# Patient Record
Sex: Male | Born: 1935 | Race: White | Hispanic: No | Marital: Married | Smoking: Never smoker
Health system: Southern US, Community
[De-identification: ages and names within clinical notes are randomized; demographics above are authoritative.]

## PROBLEM LIST (undated history)

## (undated) DIAGNOSIS — H9190 Unspecified hearing loss, unspecified ear: Secondary | ICD-10-CM

## (undated) DIAGNOSIS — M459 Ankylosing spondylitis of unspecified sites in spine: Secondary | ICD-10-CM

## (undated) DIAGNOSIS — M069 Rheumatoid arthritis, unspecified: Secondary | ICD-10-CM

## (undated) DIAGNOSIS — K5730 Diverticulosis of large intestine without perforation or abscess without bleeding: Secondary | ICD-10-CM

## (undated) DIAGNOSIS — I10 Essential (primary) hypertension: Secondary | ICD-10-CM

## (undated) HISTORY — PX: INGUINAL HERNIA REPAIR: SUR1180

## (undated) HISTORY — DX: Unspecified hearing loss, unspecified ear: H91.90

## (undated) HISTORY — DX: Diverticulosis of large intestine without perforation or abscess without bleeding: K57.30

## (undated) HISTORY — DX: Ankylosing spondylitis of unspecified sites in spine: M45.9

## (undated) HISTORY — PX: JOINT REPLACEMENT: SHX530

## (undated) HISTORY — DX: Rheumatoid arthritis, unspecified: M06.9

## (undated) HISTORY — PX: REVISION TOTAL HIP ARTHROPLASTY: SHX766

## (undated) HISTORY — PX: ADENOIDECTOMY: SUR15

## (undated) HISTORY — PX: EYE SURGERY: SHX253

## (undated) HISTORY — PX: TONSILLECTOMY: SUR1361

## (undated) HISTORY — DX: Essential (primary) hypertension: I10

---

## 1998-07-11 ENCOUNTER — Ambulatory Visit (HOSPITAL_BASED_OUTPATIENT_CLINIC_OR_DEPARTMENT_OTHER): Admission: RE | Admit: 1998-07-11 | Discharge: 1998-07-11 | Payer: Self-pay | Admitting: General Surgery

## 2003-03-03 ENCOUNTER — Encounter: Payer: Self-pay | Admitting: Internal Medicine

## 2004-06-26 ENCOUNTER — Encounter: Payer: Self-pay | Admitting: Internal Medicine

## 2004-06-26 LAB — CONVERTED CEMR LAB: PSA: 2.2 ng/mL

## 2004-07-10 ENCOUNTER — Encounter: Payer: Self-pay | Admitting: Internal Medicine

## 2004-08-05 ENCOUNTER — Ambulatory Visit: Payer: Self-pay | Admitting: Internal Medicine

## 2004-08-05 ENCOUNTER — Encounter: Payer: Self-pay | Admitting: Internal Medicine

## 2004-08-05 LAB — HM COLONOSCOPY

## 2004-08-28 ENCOUNTER — Ambulatory Visit: Payer: Self-pay | Admitting: Internal Medicine

## 2005-05-06 ENCOUNTER — Ambulatory Visit: Payer: Self-pay | Admitting: Internal Medicine

## 2005-11-03 ENCOUNTER — Ambulatory Visit: Payer: Self-pay | Admitting: Internal Medicine

## 2006-03-22 ENCOUNTER — Encounter: Admission: RE | Admit: 2006-03-22 | Discharge: 2006-03-22 | Payer: Self-pay | Admitting: Orthopedic Surgery

## 2006-06-22 ENCOUNTER — Ambulatory Visit: Payer: Self-pay | Admitting: Internal Medicine

## 2006-07-27 ENCOUNTER — Inpatient Hospital Stay (HOSPITAL_COMMUNITY): Admission: RE | Admit: 2006-07-27 | Discharge: 2006-07-30 | Payer: Self-pay | Admitting: Orthopedic Surgery

## 2007-10-24 ENCOUNTER — Ambulatory Visit: Payer: Self-pay | Admitting: Internal Medicine

## 2007-10-25 ENCOUNTER — Encounter: Payer: Self-pay | Admitting: Internal Medicine

## 2007-10-25 DIAGNOSIS — K5730 Diverticulosis of large intestine without perforation or abscess without bleeding: Secondary | ICD-10-CM | POA: Insufficient documentation

## 2007-10-25 DIAGNOSIS — M069 Rheumatoid arthritis, unspecified: Secondary | ICD-10-CM | POA: Insufficient documentation

## 2007-10-25 DIAGNOSIS — M459 Ankylosing spondylitis of unspecified sites in spine: Secondary | ICD-10-CM | POA: Insufficient documentation

## 2008-06-06 ENCOUNTER — Ambulatory Visit: Payer: Self-pay | Admitting: Internal Medicine

## 2008-06-06 DIAGNOSIS — H9190 Unspecified hearing loss, unspecified ear: Secondary | ICD-10-CM | POA: Insufficient documentation

## 2008-06-06 LAB — CONVERTED CEMR LAB
Blood in Urine, dipstick: NEGATIVE
Nitrite: NEGATIVE
Protein, U semiquant: NEGATIVE
WBC Urine, dipstick: NEGATIVE

## 2008-06-07 LAB — CONVERTED CEMR LAB
AST: 19 units/L (ref 0–37)
Albumin: 4.2 g/dL (ref 3.5–5.2)
Alkaline Phosphatase: 66 units/L (ref 39–117)
BUN: 15 mg/dL (ref 6–23)
Bilirubin, Direct: 0.1 mg/dL (ref 0.0–0.3)
Chloride: 110 meq/L (ref 96–112)
Eosinophils Absolute: 0.2 10*3/uL (ref 0.0–0.7)
Eosinophils Relative: 3.4 % (ref 0.0–5.0)
GFR calc non Af Amer: 79 mL/min
HDL: 37.2 mg/dL — ABNORMAL LOW (ref >39.0)
MCV: 94.2 fL (ref 78.0–100.0)
Monocytes Relative: 8.1 % (ref 3.0–12.0)
Neutrophils Relative %: 59.8 % (ref 43.0–77.0)
Platelets: 235 10*3/uL (ref 150–400)
Potassium: 4.4 meq/L (ref 3.5–5.1)
Sodium: 143 meq/L (ref 135–145)
Total CHOL/HDL Ratio: 5.2
VLDL: 19 mg/dL (ref 0–40)
WBC: 4.8 10*3/uL (ref 4.5–10.5)

## 2008-06-26 ENCOUNTER — Telehealth: Payer: Self-pay | Admitting: Internal Medicine

## 2008-07-03 ENCOUNTER — Encounter: Payer: Self-pay | Admitting: Internal Medicine

## 2008-11-29 ENCOUNTER — Ambulatory Visit: Payer: Self-pay | Admitting: Internal Medicine

## 2008-12-06 ENCOUNTER — Ambulatory Visit: Payer: Self-pay

## 2008-12-06 ENCOUNTER — Encounter: Payer: Self-pay | Admitting: Internal Medicine

## 2009-09-18 ENCOUNTER — Ambulatory Visit: Payer: Self-pay | Admitting: Family Medicine

## 2009-12-23 ENCOUNTER — Telehealth: Payer: Self-pay | Admitting: Internal Medicine

## 2009-12-30 ENCOUNTER — Ambulatory Visit: Payer: Self-pay | Admitting: Internal Medicine

## 2009-12-30 DIAGNOSIS — I10 Essential (primary) hypertension: Secondary | ICD-10-CM | POA: Insufficient documentation

## 2010-07-03 ENCOUNTER — Ambulatory Visit: Payer: Self-pay | Admitting: Internal Medicine

## 2010-07-03 LAB — CONVERTED CEMR LAB
Bilirubin Urine: NEGATIVE
Blood in Urine, dipstick: NEGATIVE
Ketones, urine, test strip: NEGATIVE
Protein, U semiquant: NEGATIVE
Urobilinogen, UA: 0.2

## 2010-07-04 LAB — CONVERTED CEMR LAB
ALT: 21 units/L (ref 0–53)
AST: 21 units/L (ref 0–37)
BUN: 18 mg/dL (ref 6–23)
Basophils Absolute: 0 10*3/uL (ref 0.0–0.1)
Bilirubin, Direct: 0.1 mg/dL (ref 0.0–0.3)
Calcium: 9.2 mg/dL (ref 8.4–10.5)
Cholesterol: 199 mg/dL (ref 0–200)
Creatinine, Ser: 1 mg/dL (ref 0.4–1.5)
Eosinophils Relative: 3.3 % (ref 0.0–5.0)
GFR calc non Af Amer: 74.54 mL/min (ref >60)
Glucose, Bld: 117 mg/dL — ABNORMAL HIGH (ref 70–99)
HDL: 36.9 mg/dL — ABNORMAL LOW (ref >39.00)
LDL Cholesterol: 134 mg/dL — ABNORMAL HIGH (ref 0–99)
Monocytes Absolute: 0.6 10*3/uL (ref 0.1–1.0)
Monocytes Relative: 10.3 % (ref 3.0–12.0)
Neutrophils Relative %: 57.9 % (ref 43.0–77.0)
Platelets: 237 10*3/uL (ref 150.0–400.0)
RDW: 13.4 % (ref 11.5–14.6)
Total Bilirubin: 0.8 mg/dL (ref 0.3–1.2)
Triglycerides: 143 mg/dL (ref 0.0–149.0)
VLDL: 28.6 mg/dL (ref 0.0–40.0)
WBC: 5.7 10*3/uL (ref 4.5–10.5)

## 2010-07-10 LAB — CONVERTED CEMR LAB: Hgb A1c MFr Bld: 6.3 % (ref 4.6–6.5)

## 2010-10-21 NOTE — Assessment & Plan Note (Signed)
Summary: fu on bp/njr   Vital Signs:  Patient profile:   75 year old male Weight:      204 pounds BMI:     28.96 Temp:     97.6 degrees F oral Pulse rate:   64 / minute Pulse rhythm:   regular Resp:     12 per minute BP sitting:   128 / 78  (left arm) Cuff size:   regular  Vitals Entered By: Gladis Riffle, RN (December 30, 2009 11:17 AM) CC: FU BP, does not check BP at home--med review and refill Is Patient Diabetic? No   CC:  FU BP and does not check BP at home--med review and refill.  History of Present Illness:  Follow-Up Visit      This is a 75 year old man who presents for Follow-up visit.  The patient denies chest pain and palpitations.  Since the last visit the patient notes no new problems or concerns.  The patient reports taking meds as prescribed.  When questioned about possible medication side effects, the patient notes none.    All other systems reviewed and were negative   Preventive Screening-Counseling & Management  Alcohol-Tobacco     Smoking Status: never  Current Medications (verified): 1)  Hydrochlorothiazide 25 Mg  Tabs (Hydrochlorothiazide) .... Take 1 Tablet By Mouth Once A Day--Needs Office Visit in March 2)  Celebrex 200 Mg Caps (Celecoxib) .... As Needed  Allergies (verified): No Known Drug Allergies  Past History:  Past Surgical History: Last updated: 10/25/2007 THA x 2- 1978 & 1992 Inguinal herniorrhaphy repair X3 T&A  Family History: Last updated: 10/25/2007 Family History Diabetes 1st degree relative Family History of Cardiovascular disorder mother deceased age 82 (heart problems, DM) father deceased age 41 (DM) 3 brothers healthy 1 sister healthy  Social History: Last updated: 10/25/2007 Never Smoked Retired Drug use-no Alcohol use-no Married 1 son healthy  Risk Factors: Smoking Status: never (12/30/2009)  Past Medical History: THA x 2- 1978 & 1992 Colonoscopy1115/1996 Rheumatoid arthritis ankylosing  spondylitis Diverticulosis, colon hemorrhoids Hypertension  Physical Exam  General:  Well-developed,well-nourished,in no acute distress; alert,appropriate and cooperative throughout examination Neck:  No deformities, masses, or tenderness noted. Lungs:  normal respiratory effort and no intercostal retractions.   Heart:  normal rate and regular rhythm.     Impression & Recommendations:  Problem # 1:  HYPERTENSION (ICD-401.9) tolerating meds continue current medications  His updated medication list for this problem includes:    Hydrochlorothiazide 25 Mg Tabs (Hydrochlorothiazide) .Marland Kitchen... Take 1 tablet by mouth once a day  BP today: 128/78 Prior BP: 120/68 (09/18/2009)  Labs Reviewed: K+: 4.4 (06/06/2008) Creat: : 1.0 (06/06/2008)   Chol: 192 (06/06/2008)   HDL: 37.2 (06/06/2008)   LDL: 136 (06/06/2008)   TG: 95 (06/06/2008)  Complete Medication List: 1)  Hydrochlorothiazide 25 Mg Tabs (Hydrochlorothiazide) .... Take 1 tablet by mouth once a day 2)  Celebrex 200 Mg Caps (Celecoxib) .... As needed Prescriptions: HYDROCHLOROTHIAZIDE 25 MG  TABS (HYDROCHLOROTHIAZIDE) Take 1 tablet by mouth once a day  #90 x 3   Entered and Authorized by:   Birdie Sons MD   Signed by:   Birdie Sons MD on 12/30/2009   Method used:   [facility]  [address] [id number]       [address]       [place], [place]  [address]       Ph: [phone] or [phone]  Fax: [phone]   RxID:   [id number]

## 2010-10-21 NOTE — Progress Notes (Signed)
Summary: req call  Phone Note Call from Patient Call back at [phone]   Reason for Call: Talk to Nurse Summary of Call: Says his prescriptions for hydrochlorothiazide are being marked ov necessary.  [place] the beach [place] and is scheduled up through May, could schedule appointment then if necessary.  He prefers to delay his ov/physical until fall, but he needs to get his pills until then also.  Please let him know what he has to do to get his Rx continuously and that this is the only med he takes & needs.   Initial call taken by: Rudy Jew, RN,  December 23, 2009 12:45 PM  Follow-up for Phone Call        Left message on cell that pt needs ov when is back in town to evaluate how med is working.  He can wait until fall for cpx.  Is to call back for ov and call pharmacy for refill request. Follow-up by: Gladis Riffle, RN,  December 23, 2009 3:48 PM

## 2010-10-21 NOTE — Assessment & Plan Note (Signed)
Summary: CPX (PT WILL COME IN FASTING) - FLU SHOT // RS--Rm 13   Vital Signs:  Patient profile:   75 year old male Height:      70.5 inches Weight:      211 pounds BMI:     29.96 Temp:     97.9 degrees F oral Pulse rate:   66 / minute Pulse rhythm:   regular Resp:     16 per minute BP sitting:   120 / 70  (left arm) Cuff size:   large  Vitals Entered By: Mervin Kung CMA Duncan Dull) (July 03, 2010 7:58 AM) CC: Rm 13  Pt here for fasting physical. Is Patient Diabetic? No Pain Assessment Patient in pain? no      Comments Pt states hs has not used Celebrex in years. Other med dose and directions are correct. Nicki Guadalajara Fergerson CMA (AAMA)  July 03, 2010 8:03 AM    CC:  Rm 13  Pt here for fasting physical..  History of Present Illness: CPX  Preventive Screening-Counseling & Management  Alcohol-Tobacco     Smoking Status: never  Caffeine-Diet-Exercise     Caffeine use/day: 3 drinks daily  Current Problems (verified): 1)  Hypertension  (ICD-401.9) 2)  Hearing Loss  (ICD-389.9) 3)  Preventive Health Care  (ICD-V70.0) 4)  Diverticulosis, Colon  (ICD-562.10) 5)  Ankylosing Spondylitis  (ICD-720.0) 6)  Rheumatoid Arthritis  (ICD-714.0)  Current Medications (verified): 1)  Hydrochlorothiazide 25 Mg  Tabs (Hydrochlorothiazide) .... Take 1 Tablet By Mouth Once A Day  Allergies (verified): No Known Drug Allergies  Past History:  Family History: Last updated: 10/25/2007 Family History Diabetes 1st degree relative Family History of Cardiovascular disorder mother deceased age 100 (heart problems, DM) father deceased age 40 (DM) 3 brothers healthy 1 sister healthy  Social History: Last updated: 10/25/2007 Never Smoked Retired Drug use-no Alcohol use-no Married 1 son healthy  Past Medical History: THA x 2- 1978 & 1992 Colonoscopy1115/1996 Rheumatoid arthritis--as teenager. ? JRA ankylosing spondylitis---no sxs since age 40s Diverticulosis,  colon hemorrhoids Hypertension  Past Surgical History: Reviewed history from 10/25/2007 and no changes required. THA x 2- 1978 & 1992 Inguinal herniorrhaphy repair X3 T&A  Social History: Caffeine use/day:  3 drinks daily   Impression & Recommendations:  Problem # 1:  PREVENTIVE HEALTH CARE (ICD-V70.0) health maintenance is up to date. I've advised him to exercise regularly. Follow low fat, low calorie diet. Orders: Venipuncture ([address]109) TLB-Lipid Panel (80061-LIPID) TLB-BMP (Basic Metabolic Panel-BMET) (80048-METABOL) UA Dipstick w/o Micro (automated)  (81003) Specimen Handling (60454) TLB-CBC Platelet - w/Differential (85025-CBCD) TLB-Hepatic/Liver Function Pnl (80076-HEPATIC) TLB-TSH (Thyroid Stimulating Hormone) (84443-TSH) TLB-PSA (Prostate Specific Antigen) (84153-PSA)  Complete Medication List: 1)  Hydrochlorothiazide 25 Mg Tabs (Hydrochlorothiazide) .... Take 1 tablet by mouth once a day  Other Orders: Flu Vaccine 57yrs + MEDICARE PATIENTS (U9811) Administration Flu vaccine - MCR (G0008) Tdap => 69yrs IM (91478) Admin 1st Vaccine (29562)     Current Allergies (reviewed today): No known allergies   Flu Vaccine Consent Questions     Do you have a history of severe allergic reactions to this vaccine? no    Any prior history of allergic reactions to egg and/or gelatin? no    Do you have a sensitivity to the preservative Thimersol? no    Do you have a past history of Guillan-Barre Syndrome? no    Do you currently have an acute febrile illness? no    Have you ever had a severe reaction to latex? no  Vaccine information given and explained to patient? yes    Are you currently pregnant? no    Lot Number:[id number]   Exp Date:03/21/2011   Site Given  Right Deltoid IM. Nicki Guadalajara Fergerson CMA (AAMA)  July 03, 2010 8:07 AM Physical Exam General Appearance: well developed, well nourished, no acute distress Eyes: conjunctiva and lids normal, PERRL, EOMI, Ears,  Nose, Mouth, Throat: TM clear, nares clear, oral exam WNL Neck: supple, no lymphadenopathy, no thyromegaly, no JVD Respiratory: clear to auscultation and percussion, respiratory effort normal Cardiovascular: regular rate and rhythm, S1-S2, no murmur, rub or gallop, no bruits, peripheral pulses normal and symmetric, no cyanosis, clubbing, edema or varicosities Chest: no scars, masses, tenderness; no asymmetry, skin changes,   Gastrointestinal: soft, non-tender; no hepatosplenomegaly, masses; active bowel sounds all quadrants,  no masses, tenderness, hemorrhoids  Genitourinary: no hernia,  or prostate enlargement Lymphatic: no cervical, axillary or inguinal adenopathy Musculoskeletal: gait normal, muscle tone and strength WNL, no joint swelling, effusions, discoloration, crepitus  Skin: clear, good turgor, color WNL, no rashes, lesions, or ulcerations Neurologic: normal mental status, normal reflexes, normal strength, sensation, and motion Psychiatric: alert; oriented to person, place and time Other Exam:       Immunizations Administered:  Tetanus Vaccine:    Vaccine Type: Tdap    Site: left deltoid    Mfr: GlaxoSmithKline    Dose: 0.5 ml    Route: IM    Given by: Mervin Kung CMA (AAMA)    Exp. Date: 07/10/2012    Lot #: [id number]    VIS given: 08/08/08 version given July 03, 2010.   Laboratory Results   Urine Tests    Routine Urinalysis   Color: yellow Appearance: Clear Glucose: negative   (Normal Range: Negative) Bilirubin: negative   (Normal Range: Negative) Ketone: negative   (Normal Range: Negative) Spec. Gravity: 1.025   (Normal Range: 1.003-1.035) Blood: negative   (Normal Range: Negative) pH: 5.0   (Normal Range: 5.0-8.0) Protein: negative   (Normal Range: Negative) Urobilinogen: 0.2   (Normal Range: 0-1) Nitrite: negative   (Normal Range: Negative) Leukocyte Esterace: negative   (Normal Range: Negative)    Comments: Rita Ohara  July 03, 2010  10:13 AM

## 2010-12-25 ENCOUNTER — Other Ambulatory Visit: Payer: Self-pay | Admitting: Internal Medicine

## 2010-12-29 ENCOUNTER — Other Ambulatory Visit: Payer: Self-pay | Admitting: *Deleted

## 2010-12-29 NOTE — Telephone Encounter (Signed)
Opened in error

## 2011-01-16 ENCOUNTER — Other Ambulatory Visit: Payer: Self-pay | Admitting: Internal Medicine

## 2011-01-16 MED ORDER — HYDROCHLOROTHIAZIDE 25 MG PO TABS: 25.0000 mg | ORAL_TABLET | Freq: Every day | ORAL | Status: DC

## 2011-01-16 NOTE — Telephone Encounter (Signed)
rx sent in electronically 

## 2011-01-16 NOTE — Telephone Encounter (Signed)
Pt called and tried to get [facility] [address] [phone] and was told that pt would need to sch ov, before more refills could be given. Pt said that Dr Cato Mulligan told pt, the next this happens, to call office and pt would not need to sch ov. Pls call in asap today.

## 2011-02-06 NOTE — Letter (Signed)
June 22, 2006     Madlyn Frankel. Charlann Boxer, M.D.  [facility]  [address]  [address]  [place], [place] [address]   RE:  Jason, [name]  MRN:  [id number]  /  DOB:  1936-04-28   Dear Susy Frizzle:   Thank you for referring Mr. Custis for preop medical clearance.  I  understand that he needs a revision of his right hip replacement.  A full  evaluation is in the medical records at my office.  I did perform a complete  evaluation and EKG in the office, both of which were normal.  Also, his  history is not concerning for any coronary artery disease or respiratory  problems.  It is worth noting he had a nuclear stress test in October 2005  that was normal.  I do not think any further evaluation is necessary prior  to surgery.  He would be at normal risk for his age.  If you need any  perioperative management help for medical problems, please contact me.    Sincerely,      Bruce H. Swords, MD    BHS/MedQ  DD:  06/22/2006  DT:  06/22/2006  Job #:  [id number]
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
## 2011-02-06 NOTE — Discharge Summary (Signed)
Jason [name], Jason [name]            ACCOUNT NO.:  [id number]   MEDICAL RECORD NO.:  [id number]          PATIENT TYPE:  INP   LOCATION:  1507                         FACILITY:  [facility]   PHYSICIAN:  Madlyn Frankel. Charlann Boxer, M.D.  DATE OF BIRTH:  28-Oct-1935   DATE OF ADMISSION:  07/27/2006  DATE OF DISCHARGE:  07/30/2006                               DISCHARGE SUMMARY   ADMITTING DIAGNOSES:  1. Osteoarthritis.  2. Status post total  hip replacement.  3. Hypopotassemia.  4. Hypertension.   DISCHARGE DIAGNOSES:  1. Osteoporosis.  2. Status post total  hip replacement.  3. Hypopotassemia.  4. Hypertension.  5. Postoperative hypokalemia.   PROCEDURE:  Revision of right total hip arthroplasty.  Surgeon - Madlyn Frankel. Charlann Boxer, M.D.  Assistant - Sharlet Salina L. [address], PA.   CONSULTS:  None.   HISTORY OF PRESENT ILLNESS:  Jason [name] is a very pleasant 75 year old  male who has had bilateral hip replacements.  The right was done in  1991.  He has had persistent anterior thigh pain since 2000.  Significant pain with activity and hip flexion.  He had bone scans, as  well as labs to rule out infection.  The bone scans showed increased  activity in the femoral stem, indicating loosening of the stem.  He has  significant discomfort with any kind of activity, and, due to the  loosening, it called for a revision total hip replacement.   PREADMISSION LABORATORIES:  CBC which showed his preadmission hemoglobin  of 14.5, hematocrit 42.2, tracked throughout his course of stay.  Upon  discharge, hemoglobin 10.4, hematocrit 29.8.  Preadmission white cell  differential within normal limits.  Preadmission coagulation within  normal limits.  Preadmission routine chemistries showed a sodium of 140,  potassium 4.8, glucose 111.  Upon discharge, his glucose was 136,  potassium 3.5, glucose 161.  Preadmission urinalysis was negative.  Preadmission blood was A positive.  Preadmission chest view shows no  active  cardiopulmonary disease.  Postoperative pelvis showed new right  hip replacement without complicating features.  Preadmission EKG showed  normal EKG.   HOSPITAL COURSE:  The patient tolerated the procedure well and was  admitted from the PACU to the orthopedic floor.  On postoperative day  #1, he was doing well.  Neuromuscularly and vascularly intact, which he  remained so throughout his course of stay.  The dressing was clean, dry  and intact.  Hemovac was pulled intact.  Discharge was planned for the  following Friday.  On postoperative day #2, he had some postoperative  hypokalemia and was replaced with K-Dur 40 mEq once.  He has not had a  bowel movement since his surgery.  Physical therapy was begun, and he  used a rolling walker.  By postoperative day #3, he was doing fine and  was having some problem with sleep.  He did not yet have a bowel  movement, so he was given an enema or choice and laxative of choice.  Physical therapy progressed well, and by the time of discharge, he was  using the rolling walker, walking up to 250 feet, and was  recommended 7  times per week physical therapy.  Occupation therapy - he was able to do  transfers from bed to chair to bathroom.   DISCHARGE DISPOSITION:  Stable, improved.  Discharged home with home  health care physical therapy in [facility].   DISCHARGE MEDICATIONS:  1. HCTZ 25 mg one p.o. q.a.m.  2. Calcium one p.o. two daily.  3. Lovenox 40 mg one subcutaneous q.24h. x11 days.  4. Robaxin 500 mg 1-2 p.o. q.4-6h. p.r.n. muscle spasm.  5. Norco 5/325, 1-2 p.o. q.4-6h. p.r.n. pain.  6. Ambien 5 mg one p.o. q.h.s. p.r.n. sleeplessness.  7. K-Dur 40 mEq one p.o. daily x7 days.  8. Iron 325 x3 daily x3 weeks.   DISCHARGE PHYSICAL THERAPY:  Continue physical therapy, partial  weightbearing, 50%, with the use of a rolling walker.  Once he returns  to our clinic, will reassess his progress, and eventually will progress  to weightbearing  as tolerated with the use of a  cane.  He wanted to  work on his upper body strength and his balance.  Want to increase range  of motion, minimize pain, increase strength, and encourage activities of  daily living.   WOUND CARE:  Keep the wound dry.  Change the dressings daily.  Will see  him back in the office for followup on the incisions and recheck the  wound.   DISCHARGE FOLLOW UP:  Follow up with Dr. Charlann Boxer at [phone] in  approximately 7-10 days for a wound check.  We look forward to treating  this very pleasant 75 year old gentleman.     ______________________________  Yetta Glassman. [address], [place]      Madlyn Frankel. Charlann Boxer, M.D.  Electronically Signed    BLM/MEDQ  D:  09/16/2006  T:  09/16/2006  Job:  [id number]

## 2011-02-06 NOTE — Op Note (Signed)
Jason [name], [name]            ACCOUNT NO.:  [id number]   MEDICAL RECORD NO.:  [id number]          PATIENT TYPE:  INP   LOCATION:  NA                           FACILITY:  [facility]   PHYSICIAN:  Madlyn Frankel. Charlann Boxer, M.D.  DATE OF BIRTH:  [birth year]   DATE OF PROCEDURE:  07/27/2006  DATE OF DISCHARGE:                                 OPERATIVE REPORT   PREOPERATIVE DIAGNOSES:  1. Failed right total hip replacement; and,  2. Loose femoral acetabular component.   POSTOPERATIVE DIAGNOSES:  1. Failed right total hip replacement; and,  2. Loose femoral acetabular component.   OPERATION PERFORMED:  Revision of right total hip replacement specifically  utilizing a Series II acetabular liner, 10-degree, that matched the 58 cup  that was present with a 10-degree loop.  The femoral stem was size 15, 10-  inch bowed femur or Solution stem with a 32 plus 5 ceramic ball.   SURGEON:  Madlyn Frankel. Charlann Boxer, M.D.   ASSISTANT:  Yetta Glassman. Mann, P. A.   ANESTHESIA:  General.   ESTIMATED BLOOD LOSS:  The blood loss was 300 mL.   DRAINS:  The drains were times one.   COMPLICATIONS:  None.   INDICATIONS FOR THE SURGERY:  [name] is a 75 year old gentleman who  presented to the office for Dr. Darrelyn Hillock.  He had right total hip replacement  performed in 1991 by Dr. Trellis Paganini.  The patient had a history of osteolysis  that was treated with some Fosamax and did not require any revision surgery.  Nonetheless, he presented to the office with thigh pain with activity.  Radiographs were concerning for a loose femoral stem.   Upon review of him the surgical options consent was obtained for revision  hip surgery.  We reviewed the risks of infection, DVT, need for revision  surgery, and perioperative complications associated with revision surgery.  Consent was obtained.   DESCRIPTION OF THE OPERATION:  The patient was brought to the operative  theater.  Once adequate anesthesia and preoperative antibiotics  were  administered the patient was positioned in the left lateral decubitus  position with the right side up.  The right lower extremity was then prepped  and draped in a sterile fashion.  Please note that preoperatively the  patient has been worked up for infection  and this was negative.  There was  no concern for infection.  The intraoperative findings revealed no findings  for infection.   The patient's previous incision was identified, marked out and prepared for  a distal incision for any femoral fracture, window or osteotomy necessary to  remove the component.  Posterior incision was used and with sharp dissection  it was carried down to the iliotibial band and gluteal fascia.  This was  incised along the incision.  Sharp dissection was carried done through the  scar tissue present posteriorly.  The hip joint was identified.   After debriding some of the scar tissue and pseudocapsule tissue off the  proximal femur the hip was dislocated.  It was at this time that we noted  that the femoral stem  was grossly loose.  This was able be removed much to  my pleasure with the use of a bone tamp and my hands.  I did not leave a  bullet on the tip of this Osteonics and Tru-Fit stem it remained in  continuity with the stem.  It did not require any further  advanced surgery  i.e. osteotomy.   With the femoral stem out attention was first directed to the acetabulum.  Following further debridement, exposing the rim of the cup we identified the  cup was securely fit into the acetabulum.  I then used a 4.5 drill bit and a  6.5 cancellous screw to remove the cup.  The cup position was in  approximately 40-45 degrees of adduction and only about 10 degrees of  forward flexion.  For this reason I went ahead and placed a 10-degree lip  liner, which was what I removed with it posteriorly at about the nine to 10  o'clock position.   Following placement of this attention was focused on the femur for  the vast  majority of the case.  Proximal femoral closure was obtained including  debridement.  It was noted that following the debridement the proximal femur  was very osteolytic and very whimsical with regards to bone. There was some  fracture.  The calcar region of bone was not very good at all.  Based on  this, although I had preoperatively  planned for the primary option of an S-  ROM approximately loading pin I had to convert to a solution of fluid porous  option based on the proximal bone quality.   Preparation of the proximal bone was carried out until after placement of  the stem in this nature without placing it into varus.  We also prepped out  the fluoroscopy and fully prepped it out so it could be flipped over in a U  fashion over the patient's leg to allow for imaging.  Based on the fact that  the stem had tucked into varus, and it was touching and abutting on the  lateral and anterolateral cortex  I wanted to make sure that the guidewire  was passed through it, and there was also noted to be a bony pedestal.  With  fluoroscopy ready I then passed a 4.5 drill bit through the bony pedestal  under radiographic imaging, AP and lateral planes.  I then passed a  guidewire to the knee.  I then reamed with flexible reamers beginning with a  size eight and increasing by half millimeter increments all the way up to a  14 mm increment.   At this point I straight reamed beginning with a 12.5 and 13, and 13.5 and  14.  At this point I evaluated  the distance that I would need for the stem.  It was at this point with fluoroscopy and trials that I determined that a 10  inch stem would give me the best bony contact distally, and distal to where  the old stem was.  Based on the bone quality and the bone that was present I  chose to go up to a size 15 mm stem.  I went ahead and reamed two 15 at both of the flexible reamers and also the straight reamers to help pass this area  where the old  stem had been.  Broaching was then carried out starting with a  12, then 13.5 and 15 first with a small body and then the large body.  Given  the fact that we did a 10-inch bowed solutio stem it only had a large body.  There was very little purchase in this proximal bone given the ectatic and  lytic nature of it.   Trial reduction was carried out with this 10-inch bowed stem.  I was happy  with the position of the stem in relationship to the tip of the trochanter.  Given all these parameters the final 10-inch bowed stem was opened.  The  final polyethylene insert was opened and held on the ceramic ball.  The  trial components were removed and the final acetabular liner was positioned.  This was positioned in the same orientation as the trial at the slot between  the nine and 10 o'clock position.  This was impacted without difficulty and  without any interference around the rim.   Following this attention was directed to the femur.  With the flipped  fluoroscopy still available I passed the stem by hand and initial soft taps  passed  where the old stem was.  At this point holding the stem at  approximately 25-30 degrees of anteversion I impacted the stem down with  multiple taps.  I had gotten it down to the tip of the trunion was at the  level of the trochanter and trialed for trial reduction.  Initially I used a  32 plus one ball.  There were a couple millimeters of shuck and the leg  length appeared to be a little bit shorter, so I went ahead and tried the  plus five.  The combined anteversion was noted to be about 45-50 degrees  with the five ball and there was about a millimeter shuck, and the leg  lengths appeared to be within a millimeter too.  With this I was satisfied  and opened up the 32 plus 5 Biolox Delta ceramic ball. This was impacted  onto a clean and dry trunion for the Solution  stem.   The hip was reduced, irrigated throughout the case and again at this point  with a  liter of fluid.  A medium Hemovac drain was placed deep. I  reapproximated the posterior capsular tissue to the superior leaflet.  The  reminder of the wound was closed in layers with #1-Ethibond on the  iliotibial and, #1 Vicryl on the gluteal fascia, 2-0 Vicryl in the subcu  layer, and staples on the skin. The skin was cleaned, dried an dressed  sterilely with adaptive dressing with tape.   The patient was then awakened from anesthesia and taken to the recovery room  in stable condition.      Madlyn Frankel Charlann Boxer, M.D.  Electronically Signed     MDO/MEDQ  D:  07/27/2006  T:  07/28/2006  Job:  [id number]

## 2011-04-15 ENCOUNTER — Other Ambulatory Visit (HOSPITAL_COMMUNITY): Payer: Self-pay | Admitting: Orthopedic Surgery

## 2011-04-15 DIAGNOSIS — T84038A Mechanical loosening of other internal prosthetic joint, initial encounter: Secondary | ICD-10-CM

## 2011-04-15 DIAGNOSIS — M25552 Pain in left hip: Secondary | ICD-10-CM

## 2011-04-15 DIAGNOSIS — Z96649 Presence of unspecified artificial hip joint: Secondary | ICD-10-CM

## 2011-04-23 ENCOUNTER — Encounter (HOSPITAL_COMMUNITY)
Admission: RE | Admit: 2011-04-23 | Discharge: 2011-04-23 | Disposition: A | Discharge status: Home / Self Care | Payer: Medicare Other | Source: Ambulatory Visit | Attending: Orthopedic Surgery | Admitting: Orthopedic Surgery

## 2011-04-23 DIAGNOSIS — M25559 Pain in unspecified hip: Secondary | ICD-10-CM | POA: Insufficient documentation

## 2011-04-23 DIAGNOSIS — Z96649 Presence of unspecified artificial hip joint: Secondary | ICD-10-CM | POA: Insufficient documentation

## 2011-04-23 DIAGNOSIS — M25552 Pain in left hip: Secondary | ICD-10-CM

## 2011-04-23 DIAGNOSIS — T84038A Mechanical loosening of other internal prosthetic joint, initial encounter: Secondary | ICD-10-CM

## 2011-04-23 MED ORDER — TECHNETIUM TC 99M MEDRONATE IV KIT
23.6000 | PACK | Freq: Once | INTRAVENOUS | Status: AC | PRN
  Administered 2011-04-23: 23.6 via INTRAVENOUS

## 2011-05-06 ENCOUNTER — Other Ambulatory Visit: Payer: Self-pay | Admitting: Orthopedic Surgery

## 2011-05-06 DIAGNOSIS — M25559 Pain in unspecified hip: Secondary | ICD-10-CM

## 2011-05-06 DIAGNOSIS — M549 Dorsalgia, unspecified: Secondary | ICD-10-CM

## 2011-05-09 ENCOUNTER — Ambulatory Visit
Admission: RE | Admit: 2011-05-09 | Discharge: 2011-05-09 | Disposition: A | Discharge status: Home / Self Care | Payer: Medicare Other | Source: Ambulatory Visit | Attending: Orthopedic Surgery | Admitting: Orthopedic Surgery

## 2011-05-09 DIAGNOSIS — M549 Dorsalgia, unspecified: Secondary | ICD-10-CM

## 2011-05-09 DIAGNOSIS — M25559 Pain in unspecified hip: Secondary | ICD-10-CM

## 2011-06-11 ENCOUNTER — Other Ambulatory Visit: Payer: Self-pay | Admitting: Orthopedic Surgery

## 2011-06-11 DIAGNOSIS — M25559 Pain in unspecified hip: Secondary | ICD-10-CM

## 2011-06-12 ENCOUNTER — Ambulatory Visit
Admission: RE | Admit: 2011-06-12 | Discharge: 2011-06-12 | Disposition: A | Discharge status: Home / Self Care | Payer: Medicare Other | Source: Ambulatory Visit | Attending: Orthopedic Surgery | Admitting: Orthopedic Surgery

## 2011-06-12 ENCOUNTER — Other Ambulatory Visit: Payer: Self-pay | Admitting: Orthopedic Surgery

## 2011-06-12 DIAGNOSIS — M25559 Pain in unspecified hip: Secondary | ICD-10-CM

## 2011-06-12 LAB — SYNOVIAL CELL COUNT + DIFF, W/ CRYSTALS

## 2011-06-12 MED ORDER — IOHEXOL 180 MG/ML  SOLN
2.0000 mL | Freq: Once | INTRAMUSCULAR | Status: AC | PRN
  Administered 2011-06-12: 2 mL via INTRA_ARTICULAR

## 2011-06-16 ENCOUNTER — Other Ambulatory Visit: Payer: Self-pay | Admitting: Orthopedic Surgery

## 2011-06-23 ENCOUNTER — Encounter: Payer: Self-pay | Admitting: Internal Medicine

## 2011-06-24 ENCOUNTER — Ambulatory Visit (INDEPENDENT_AMBULATORY_CARE_PROVIDER_SITE_OTHER): Payer: Medicare Other | Admitting: Internal Medicine

## 2011-06-24 ENCOUNTER — Encounter: Payer: Self-pay | Admitting: Internal Medicine

## 2011-06-24 VITALS — BP 132/76 | HR 76 | Temp 98.1°F | Ht 71.5 in | Wt 204.0 lb

## 2011-06-24 DIAGNOSIS — M459 Ankylosing spondylitis of unspecified sites in spine: Secondary | ICD-10-CM

## 2011-06-24 DIAGNOSIS — I10 Essential (primary) hypertension: Secondary | ICD-10-CM

## 2011-06-24 NOTE — Assessment & Plan Note (Addendum)
Patient has had permanent ankylosing spondylitis for years. This was likely the cause of his original hip replacement. The hip replacement has now failed. He needs a redo of total hip arthroplasty. He is okay for surgery given his age. His surgical risk is average for his age.

## 2011-06-24 NOTE — Progress Notes (Signed)
  Subjective:    Patient ID: Jason [name], male    DOB: 01-Jul-1936, 75 y.o.   MRN: [id number]  HPI  Scheduled for THA--left hip. He is here at the request of Dr. Charlann Boxer for surgical clearance.  Past Medical History  Diagnosis Date  . Ankylosing spondylitis   . Diverticulosis of colon (without mention of hemorrhage)   . Unspecified hearing loss   . Hypertension   . Rheumatoid arthritis    Past Surgical History  Procedure Date  . Revision total hip arthroplasty 1978/1992    reports that he has never smoked. He does not have any smokeless tobacco history on file. He reports that he does not drink alcohol or use illicit drugs. family history includes Diabetes in his father, mother, and unspecified family member and Heart disease in his mother and unspecified family member. No Known Allergies   Review of Systems     patient denies chest pain, shortness of breath, orthopnea. Denies lower extremity edema, abdominal pain, change in appetite, change in bowel movements. Patient denies rashes, musculoskeletal complaints. No other specific complaints in a complete review of systems.   Objective:   Physical Exam  well-developed well-nourished male in no acute distress. HEENT exam atraumatic, normocephalic, neck supple without jugular venous distention. Chest clear to auscultation cardiac exam S1-S2 are regular. Abdominal exam overweight with bowel sounds, soft and nontender. Extremities no edema. Neurologic exam he is alert. Using crutches for ambulation..        Assessment & Plan:

## 2011-07-02 ENCOUNTER — Other Ambulatory Visit: Payer: Self-pay | Admitting: Orthopedic Surgery

## 2011-07-02 ENCOUNTER — Encounter (HOSPITAL_COMMUNITY): Payer: Medicare Other

## 2011-07-02 ENCOUNTER — Ambulatory Visit (HOSPITAL_COMMUNITY)
Admission: RE | Admit: 2011-07-02 | Discharge: 2011-07-02 | Disposition: A | Discharge status: Home / Self Care | Payer: Medicare Other | Source: Ambulatory Visit | Attending: Orthopedic Surgery | Admitting: Orthopedic Surgery

## 2011-07-02 ENCOUNTER — Other Ambulatory Visit (HOSPITAL_COMMUNITY): Payer: Self-pay | Admitting: Orthopedic Surgery

## 2011-07-02 DIAGNOSIS — I10 Essential (primary) hypertension: Secondary | ICD-10-CM | POA: Insufficient documentation

## 2011-07-02 DIAGNOSIS — Z01812 Encounter for preprocedural laboratory examination: Secondary | ICD-10-CM | POA: Insufficient documentation

## 2011-07-02 DIAGNOSIS — Y831 Surgical operation with implant of artificial internal device as the cause of abnormal reaction of the patient, or of later complication, without mention of misadventure at the time of the procedure: Secondary | ICD-10-CM | POA: Insufficient documentation

## 2011-07-02 DIAGNOSIS — Z01818 Encounter for other preprocedural examination: Secondary | ICD-10-CM

## 2011-07-02 DIAGNOSIS — T84099A Other mechanical complication of unspecified internal joint prosthesis, initial encounter: Secondary | ICD-10-CM | POA: Insufficient documentation

## 2011-07-02 LAB — URINALYSIS, ROUTINE W REFLEX MICROSCOPIC
Bilirubin Urine: NEGATIVE
Glucose, UA: NEGATIVE mg/dL
Hgb urine dipstick: NEGATIVE
Specific Gravity, Urine: 1.018 (ref 1.005–1.030)
pH: 6 (ref 5.0–8.0)

## 2011-07-02 LAB — DIFFERENTIAL
Eosinophils Relative: 1 % (ref 0–5)
Lymphocytes Relative: 26 % (ref 12–46)
Lymphs Abs: 1.9 10*3/uL (ref 0.7–4.0)
Neutrophils Relative %: 64 % (ref 43–77)

## 2011-07-02 LAB — CBC
HCT: 42.8 % (ref 39.0–52.0)
MCV: 92.8 fL (ref 78.0–100.0)
RBC: 4.61 MIL/uL (ref 4.22–5.81)
WBC: 7.2 10*3/uL (ref 4.0–10.5)

## 2011-07-02 LAB — APTT: aPTT: 30 seconds (ref 24–37)

## 2011-07-02 LAB — BASIC METABOLIC PANEL
Chloride: 99 mEq/L (ref 96–112)
GFR calc Af Amer: >90 mL/min (ref >90)
Potassium: 4.5 mEq/L (ref 3.5–5.1)

## 2011-07-02 LAB — SURGICAL PCR SCREEN: MRSA, PCR: NEGATIVE

## 2011-07-02 LAB — PROTIME-INR: INR: 0.98 (ref 0.00–1.49)

## 2011-07-06 ENCOUNTER — Inpatient Hospital Stay (HOSPITAL_COMMUNITY)
Admission: RE | Admit: 2011-07-06 | Discharge: 2011-07-09 | DRG: 468 | Disposition: A | Discharge status: Home Health | Payer: Medicare Other | Source: Ambulatory Visit | Attending: Orthopedic Surgery | Admitting: Orthopedic Surgery

## 2011-07-06 ENCOUNTER — Inpatient Hospital Stay (HOSPITAL_COMMUNITY): Payer: Medicare Other

## 2011-07-06 DIAGNOSIS — R42 Dizziness and giddiness: Secondary | ICD-10-CM | POA: Diagnosis not present

## 2011-07-06 DIAGNOSIS — E876 Hypokalemia: Secondary | ICD-10-CM | POA: Diagnosis not present

## 2011-07-06 DIAGNOSIS — I10 Essential (primary) hypertension: Secondary | ICD-10-CM | POA: Diagnosis present

## 2011-07-06 DIAGNOSIS — T84039A Mechanical loosening of unspecified internal prosthetic joint, initial encounter: Principal | ICD-10-CM | POA: Diagnosis present

## 2011-07-06 DIAGNOSIS — Z01812 Encounter for preprocedural laboratory examination: Secondary | ICD-10-CM

## 2011-07-06 DIAGNOSIS — Z96649 Presence of unspecified artificial hip joint: Secondary | ICD-10-CM

## 2011-07-06 DIAGNOSIS — Y831 Surgical operation with implant of artificial internal device as the cause of abnormal reaction of the patient, or of later complication, without mention of misadventure at the time of the procedure: Secondary | ICD-10-CM | POA: Diagnosis present

## 2011-07-06 DIAGNOSIS — Z01818 Encounter for other preprocedural examination: Secondary | ICD-10-CM

## 2011-07-06 LAB — TYPE AND SCREEN
ABO/RH(D): A POS
Antibody Screen: NEGATIVE

## 2011-07-06 NOTE — H&P (Signed)
  NAMESTEAVEN, Jason [name]            ACCOUNT NO.:  [id number]  MEDICAL RECORD NO.:  [id number]  LOCATION:                               FACILITY:  [facility]  PHYSICIAN:  Madlyn Frankel. Charlann Boxer, M.D.  DATE OF BIRTH:  [birth year]  DATE OF ADMISSION:  07/06/2011 DATE OF DISCHARGE:                             HISTORY & PHYSICAL   DATE OF SURGERY:  July 06, 2011.  ADMITTING DIAGNOSIS:  Loose total hip arthroplasty, left hip.  HISTORY OF PRESENT ILLNESS:  This is a 75 year old gentleman with a history of total hip arthroplasty over 30 years ago on the left cemented in, that is now loose and painful.  He has been worked up for infection and this has been negative; and at this time, the patient is now scheduled for revision total hip arthroplasty on the left.  The surgery risks, benefits, and aftercare were discussed with the patient. Questions invited and answered.  Note that he is a candidate for tranexamic acid and will receive that at surgery.  His medical doctor is Dr. Birdie Sons and he will be going home after surgery.  PAST MEDICAL HISTORY:  Drug allergies, none.  Medical illnesses include hypertension.  CURRENT MEDICATIONS: 1. Hydrochlorothiazide 25 mg daily. 2. Nabumetone 750 mg 1 b.i.d.  PREVIOUS SURGERIES:  Include; 1. Tonsils. 2. Hernia x3. 3. Left total hip arthroplasty. 4. Right total hip arthroplasty. 5. Revision of right total hip arthroplasty.  FAMILY HISTORY:  Positive for prostate cancer, diabetes, arthritis, and heart attack.  SOCIAL HISTORY:  The patient is married.  He is retired.  He does not smoke and does not drink.  REVIEW OF SYSTEMS:  CENTRAL NERVOUS SYSTEM:  Negative for headache, blurred vision, or dizziness.  PULMONARY:  Negative for shortness of breath, PND, or orthopnea.  CARDIOVASCULAR:  Negative for chest pain, palpitation.  GI:  Negative for ulcers, hepatitis.  GU:  Negative for urinary tract difficulty.  MUSCULOSKELETAL:  Positive as in  HPI.  PHYSICAL EXAMINATION:  VITAL SIGNS:  BP 160/90, respirations 16, pulse 60 and regular. GENERAL APPEARANCE:  He is a well-developed, well-nourished gentleman in no acute distress. HEENT.  Head normocephalic.  Nose patent.  Ears patent.  Pupils are equal, round, react to light.  Throat without injection. NECK:  Supple without adenopathy.  Carotids 2+ without bruit. CHEST:  Clear to auscultation.  No rales or rhonchi.  Respirations 16. HEART:  Regular rate and rhythm at 60 beats per minute without murmur. ABDOMEN:  Soft.  Active bowel sounds.  No masses, organomegaly. NEUROLOGIC:  The patient is alert and oriented to time, place, and person.  Cranial nerves II-XII grossly intact. EXTREMITIES:  Show left hip with decreased range of motion with pain. Neurovascular status intact.  Status post total hip arthroplasty with loosening.  IMPRESSION:  Failed left total hip arthroplasty.  PLAN:  Revision of left total hip arthroplasty.     Jaquelyn Bitter. Chabon, P.A.   ______________________________ Madlyn Frankel Charlann Boxer, M.D.SJC/MEDQ  D:  07/01/2011  T:  07/01/2011  Job:  [id number]  Electronically Signed by Jodene Nam P.A. on 07/06/2011 07:33:38 AM Electronically Signed by Durene Romans M.D. on 07/06/2011 08:52:27 AM

## 2011-07-07 LAB — BASIC METABOLIC PANEL
BUN: 12 mg/dL (ref 6–23)
CO2: 29 mEq/L (ref 19–32)
Calcium: 8.1 mg/dL — ABNORMAL LOW (ref 8.4–10.5)
Chloride: 100 mEq/L (ref 96–112)
Creatinine, Ser: 0.82 mg/dL (ref 0.50–1.35)
Glucose, Bld: 133 mg/dL — ABNORMAL HIGH (ref 70–99)

## 2011-07-07 LAB — CBC
Hemoglobin: 10.5 g/dL — ABNORMAL LOW (ref 13.0–17.0)
MCH: 31.7 pg (ref 26.0–34.0)
Platelets: 221 10*3/uL (ref 150–400)
RBC: 3.31 MIL/uL — ABNORMAL LOW (ref 4.22–5.81)
WBC: 7.8 10*3/uL (ref 4.0–10.5)

## 2011-07-08 LAB — CBC
HCT: 30.3 % — ABNORMAL LOW (ref 39.0–52.0)
Hemoglobin: 10.3 g/dL — ABNORMAL LOW (ref 13.0–17.0)
MCH: 31.4 pg (ref 26.0–34.0)
MCHC: 34 g/dL (ref 30.0–36.0)
MCV: 92.4 fL (ref 78.0–100.0)
Platelets: 211 K/uL (ref 150–400)
RBC: 3.28 MIL/uL — ABNORMAL LOW (ref 4.22–5.81)
RDW: 13.2 % (ref 11.5–15.5)
WBC: 9.9 K/uL (ref 4.0–10.5)

## 2011-07-08 LAB — BASIC METABOLIC PANEL WITH GFR
BUN: 11 mg/dL (ref 6–23)
CO2: 31 meq/L (ref 19–32)
Calcium: 8.4 mg/dL (ref 8.4–10.5)
Chloride: 95 meq/L — ABNORMAL LOW (ref 96–112)
Creatinine, Ser: 0.97 mg/dL (ref 0.50–1.35)
GFR calc Af Amer: >90 mL/min
GFR calc non Af Amer: 80 mL/min — ABNORMAL LOW
Glucose, Bld: 145 mg/dL — ABNORMAL HIGH (ref 70–99)
Potassium: 3.6 meq/L (ref 3.5–5.1)
Sodium: 133 meq/L — ABNORMAL LOW (ref 135–145)

## 2011-07-09 LAB — BASIC METABOLIC PANEL
CO2: 32 mEq/L (ref 19–32)
Calcium: 8.3 mg/dL — ABNORMAL LOW (ref 8.4–10.5)
Creatinine, Ser: 0.91 mg/dL (ref 0.50–1.35)
GFR calc non Af Amer: 82 mL/min — ABNORMAL LOW (ref >90)

## 2011-07-13 NOTE — Op Note (Signed)
NAMELEIBY, PIGEON NO.:  [id number]  MEDICAL RECORD NO.:  [id number]  LOCATION:  1602                         FACILITY:  [facility]  PHYSICIAN:  Madlyn Frankel. Charlann Boxer, M.D.  DATE OF BIRTH:  1936-04-26  DATE OF PROCEDURE:  07/06/2011 DATE OF DISCHARGE:                              OPERATIVE REPORT   PREOPERATIVE DIAGNOSES:  Failed left cemented total hip replacement with both the cemented femoral and acetabular components.  POSTOPERATIVE DIAGNOSES:  Failed left cemented total hip replacement with findings that included a broken femoral stem.  PROCEDURES: 1. Trochanteric osteotomy with primary repair. 2. Acetabular bone graft, allograft used. 3. Revision, left total hip replacement with the trochanteric     osteotomy.  COMPONENTS USED:  DePuy hip system, size 60 Gription multi-sector cup with a 36+ 4 neutral Ultrex liner, size 18 x 140 mm plane stem with a 20 x 75 mm conical body with a 36+ 5 delta ceramic ball.  Four 16-gauge wires were used around the osteotomy and femur.  10 cc of cancellous allograft was used in the acetabulum due to cement defect.  SURGEON:  Madlyn Frankel. Charlann Boxer, M.D.  ASSISTANT:  Lanney Gins, PA  Please note that the physician assistant was present for the entire case.  Utilized with preoperative positioning, perioperative retractor management, general facilitation of the case as well as primary wound closure.  ANESTHESIA:  General.  SPECIMENS:  None.  COMPLICATIONS:  None apparent.  DRAINS:  One Hemovac.  BLOOD LOSS:  400 cc.  INDICATION FOR PROCEDURE:  Jason [name] is a 75 year old gentleman who has been a patient of mine.  He has had a history of bilateral index total hip replacements done in the past.  I have revised his right hip. We have been following the left hip, source of pain that again progressive.  Radiographic workup did not rule out findings of failure. Even bone scan revealed evidence only at the tip of the stem;   however, he had persistent pain.  A workup including backup workup and failed multiple attempts of conservative treatment, failed to provide any significant relief for him.  Given the fact that he had this persistent pain and was despite the radiographic workup, we decided to proceed with revision hip surgery.  After reviewing the risks of infection, DVT, component failure, dislocation, and the potential need for future further surgery, in addition to the fact that given a workup and so forth, that he may have persistent pain.  Given all these risks, he consented to have his left hip revised for benefit of pain relief.  PROCEDURE IN DETAIL:  The patient was brought to the operative theater. Once adequate anesthesia, preoperative antibiotics, Ancef administered, the patient was positioned in the right lateral decubitus position left side up.  Left lower extremity was then prepped and draped in sterile fashion.  The whole thigh was exposed and was prepped for potential distal exposure.  A Time-out was performed identifying the patient, planned procedure, and extremity.  The patient's old incision was fairly extensile along the distal thigh laterally and then extended anteriorly and planned perhaps the anterolateral portion index surgery in the 73s.  I marked out the incision to allow for  posterior approach.  Following time-out, the old incision was excised and then extended to proximally for posterior approach.  Sharp dissection was carried to the iliotibial band and gluteal fascia.  With this entire area exposed, I then opened up posteriorly along the hip.  At this time, incising this area, we encountered the trochanteric wires from his previous trochanteric osteotomy.  These wires were exposed removing the portion of the soft tissue over top and then what was able to be cut and removed was removed at this time.  I then elevated the vastus lateralis off the shaft of the femur  and retracted it anteriorly.  Following this, I attended now to the cup side or to the hip joint itself following a posterior approach and excision through the capsules.  A significant debridement was carried out.  At this point, I was able to dislocate the hip.  I identified that the stem was obviously loose and when I removed it, only the proximal portion was then removed.  We then reviewed the radiographs, these ordered as recently as July 2012.  There was no radiographic evidence of this visible crack in this film.  In addition, this proximal segment being removed, some cement from proximal femur removed.  At this point, I attended to the trochanteric osteotomy.  Again, with retractors retracting the vastus lateralis anteriorly, I used a drill to drill holes into the distal portion of the osteotomy site and location of the distal portion stem, and then using thin ACL saw, I created an osteotomy in the lateral third of the femoral shaft.  I then used a longer the saw blade around the area of the greater trochanter.  Then, using a series of osteotomes, I was able to elevate the trochanteric osteotomy.  The length of the osteotomy appeared to be appropriate at this point as it was right at the end of the cement mantle.  Using a combination of standard osteotomes and flexible osteotomes, I removed the remaining cemented stem and cement without further complications to the shaft of the femur.  The distal femur did have a bit of a bony pedestal which was removed. The canal finder confirmed no further penetration to the bone.  I then irrigated the canal and I began reaming with the Reclaim reamers.  We started with13 mm reamer, reamed up by hand to 18 mm with a good torque on the femur and good bony contact.  Base from the tip of the trochanter was at about the depth of 85 mm.  Given this, the proximal portion of reamer handle was removed.  We reamed for a 20 mm body.  Given this  preparation of the femur, I now attended back to the acetabulum.  The retractors were placed for acetabular exposure including cerebellar and inferior retractor one anteriorly.  I was able to using a 1/2-inch curved osteotome and removed the cemented shell and then I was able to remove the remaining cement.  There was no compromise of the patient's native acetabular with removal of the cement.  He had relatively thin walls anterior and posterior and there was a bit superior defect where cement had been placed for fixation of the cup.  Once I removed all of the cement and curetted out fibrous tissue, I began reaming with a 50 reamer, reamed up to a 59 reamer with good anterior-posterior column stability.  I then used 10 cc of cancellous bone graft and packed it into several large and then reverse reaming with 58  reamer.  A 60 mm multihole Gription cup was chosen.  It was impacted to approximate 35 to 40 degrees of abduction and 20 degrees of forward flexion anatomically beneath the anterior rim.  Then initial fit was fairly stable.  Only a single cancellous screw was placed in the ilium with good purchase.  I did not feel any other screws were necessary based on initial scratch fit.  At this point, the trial 36+ 4 neutral liner was placed and the trial reduction carried out.  I initially used about 20 x 85 mm body which gave [place] a shorter offset and a 36+ 1.5 ball with an anteversion then to about 15 or 20 degrees on the femoral side.  A trial reduction was carried out.  Initially, I felt the leg lengths were fairly equal.  The combined anteversion was 45 to 50 degrees. There was no evidence of impingement with forward flexion, internal rotation, or with external rotation, abduction and leg lengths felt close at this point.  Given these findings, I removed all of the trial components, the final 18 mm x 140 mm stem was chosen.  The 36+ 4 neutral Ultrex liner was chosen.  The trial  liner was removed.  After irrigation of the acetabulum, the final 36+ 4 neutral Ultrex liner was impacted with good visualized rim fit.  At this point, the final18 x 140 mm stem was impacted.  Based on evaluating the relationship of the tip of the trochanter with the osteotomy in its anatomic position in relationship to the patient's inferior neck at this point, I determined that the stem was impacted to the appropriate depth with good purchase.  It did not appear to move any further distal with further impaction.  I did at this point trial with 20 x 85 mm body again.  I did this trial and felt that I was a little tight on reduction in leg lengths and I had been a few millimeter short, thus chose the trial with a 20 x 75 mm body.  With this, the combined anteversion was stable at 45 to 50 degrees.  There was no evidence of impingement.  The leg lengths appeared to be more appropriate.  Given this, the final 20 x 75 mm body was chosen.  It was initially impacted again about 20 degrees of anteversion which was where it had the trial reduction.  Utilizing the tension device, appropriate tension was placed to this proximal taper and the final locking bolt placed.  I trialed with a 36+ 5 ball and chose this as my final ball to provide a little bit of extra attention for the final components.  The final ball was impacted under clean and dry trunnion.  The hip was reduced.  The hip had been irrigated throughout the case and again at this point.  At this point, attention was directed to reapproximating osteotomy. After removing some of the lateral metaphyseal bone, the osteotomy was able to be laid over the lateral portion of the prosthesis in a near anatomic position.  At least 3 wires tensioned and then cut and bent.  A fourth wire had been placed during the placement of final stem with any propagation of osteotomy site.  At this point, the trochanteric osteotomy appeared to be stable  without any movement of the hip or with any attempted movement of the trochanter.  At this point, I reapproximated vastus lateralis loosely with 4 sutures I then reapproximated the iliotibial band and gluteal fascia using 1  Vicryl over a medium Hemovac drain.  The remaining wound was closed with 2-0 Vicryl.  Staples were used on the skin.  The skin was cleaned, dried, and dressed sterilely using Mepilex dressing.  The drain site dressed separately.  The patient was then brought to the recovery in stable condition extubated tolerating the procedure well.  Again note that Lanney Gins, was present for the entire of the case utilizing for preoperative positioning, retractor management, and general facilitation of the case as well as primary closure.     Madlyn Frankel Charlann Boxer, M.D.     MDO/MEDQ  D:  07/06/2011  T:  07/07/2011  Job:  [id number]  Electronically Signed by Durene Romans M.D. on 07/13/2011 12:39:19 PM

## 2011-07-13 NOTE — Discharge Summary (Signed)
Jason [name], FERIA NO.:  [id number]  MEDICAL RECORD NO.:  [id number]  LOCATION:  1602                         FACILITY:  [facility]  PHYSICIAN:  Madlyn Frankel. Charlann Boxer, M.D.  DATE OF BIRTH:  01/19/1936  DATE OF ADMISSION:  07/06/2011 DATE OF DISCHARGE:  07/09/2011                              DISCHARGE SUMMARY   PROCEDURE:  Revision of left total hip arthroplasty.  ATTENDING PHYSICIAN:  Madlyn Frankel. Charlann Boxer, M.D.  ADMITTING DIAGNOSIS:  Failed left cemented total hip replacement.  DISCHARGE DIAGNOSES: 1. Status post revision, left total hip arthroplasty. 2. Hypertension. 3. Hypokalemia.  HISTORY OF PRESENT ILLNESS:  The patient is a 75 year old gentleman with a history of left total hip arthroplasty over 30 years ago on the left. This was a cemented implant, which is now loosened and has become painful.  He has been worked up for infection in this hip, which was resulted to be negative.  Various options were discussed with the patient.  The patient wished to proceed with surgery.  Risks, benefits, and expectations of procedure were discussed with the patient.  The patient understands risks, benefits, and expectations and wishes to proceed with a revision of left total hip arthroplasty.  HOSPITAL COURSE:  The patient underwent the above-stated procedure on July 06, 2011.  The patient tolerated the procedure well, was brought to the recovery room in good condition, and subsequently to the floor.  Postop day 1, July 07, 2011, the patient doing well, pain was controlled, afebrile, vital signs stable,  H and H 10.5/30.3.  He is distally neurovascularly intact.  Dressing is good, clean, dry, and intact.  Hemovac drain is removed.  IV is changed to a saline lock.  The patient did well with physical therapy.  Postop day 2, July 08, 2011, the patient doing okay, patient's pain is controlled with medication.  He was complaining of a little dizziness while in bed.   An EKG was ordered, which showed no significant changes. He is afebrile, vital signs stable.  H and H 10.3/30.3.  He is distally neurovascularly intact.  Dressing continues to look good.  He continues to do well with physical therapy.  Postop day 3, July 09, 2011, the patient doing well, no events, pain is well-controlled.  Afebrile, vital signs stable.  Patient's potassium was down to 2.9 from 3.6 the day before.  Because of this, he was given 2 doses of 20 mEq of potassium.  The patient is distally neurovascularly intact.  Dressing is good, clean, dry, and intact.  The patient again did well with physical therapy.  It was felt the patient was doing well enough to be discharged home with home health PT after having physical therapy in the hospital.  DISCHARGE CONDITION:  Good.  DISCHARGE INSTRUCTIONS:  Patient will be discharged home with home health PT.  Patient will be 50% weightbearing in the left lower extremity.  The patient to keep the area dry and clean until followup. The patient will follow up at [facility] in 2 weeks.  The patient is to call with any questions or concerns.  DISCHARGE MEDICATIONS: 1. Aspirin enteric-coated 325 mg one p.o. b.i.d. times 4 weeks. 2. Benadryl 25 mg  one p.o. q.4 hours p.r.n. 3. Colace 100 mg one p.o. b.i.d. constipation. 4. Iron sulfate 325 mg one p.o. t.i.d. times 2-3 weeks. 5. Norco 7.5/325 one to two p.o. q.4-6 hours p.r.n. pain. 6. Robaxin 500 mg one p.o. q.6 hours p.r.n. muscle spasms. 7. MiraLAX 17 g p.o. q. day constipation. 8. HCTZ 25 mg one p.o. q.a.m.    ______________________________ Lanney Gins, PA   ______________________________ Madlyn Frankel. Charlann Boxer, M.D.    MB/MEDQ  D:  07/09/2011  T:  07/09/2011  Job:  [id number]  Electronically Signed by Lanney Gins PA on 07/09/2011 01:03:30 PM Electronically Signed by Durene Romans M.D. on 07/13/2011 12:39:24 PM

## 2011-09-11 ENCOUNTER — Ambulatory Visit (INDEPENDENT_AMBULATORY_CARE_PROVIDER_SITE_OTHER): Payer: Medicare Other | Admitting: Family Medicine

## 2011-09-11 ENCOUNTER — Encounter: Payer: Self-pay | Admitting: Family Medicine

## 2011-09-11 VITALS — BP 140/90 | Temp 98.8°F | Wt 210.0 lb

## 2011-09-11 DIAGNOSIS — J069 Acute upper respiratory infection, unspecified: Secondary | ICD-10-CM

## 2011-09-11 MED ORDER — GUAIFENESIN-CODEINE 100-10 MG/5ML PO SYRP: 5.0000 mL | ORAL_SOLUTION | Freq: Three times a day (TID) | ORAL | Status: AC | PRN

## 2011-09-11 MED ORDER — METHYLPREDNISOLONE ACETATE 80 MG/ML IJ SUSP
80.0000 mg | Freq: Once | INTRAMUSCULAR | Status: AC
  Administered 2011-09-11: 80 mg via INTRAMUSCULAR

## 2011-09-11 NOTE — Patient Instructions (Addendum)
1. Mucinex OTC as directed 2. Do not drive and take cough med.  Bronchitis Bronchitis is the body's way of reacting to injury and/or infection (inflammation) of the bronchi. Bronchi are the air tubes that extend from the windpipe into the lungs. If the inflammation becomes severe, it may cause shortness of breath. CAUSES  Inflammation may be caused by:  A virus.   Germs (bacteria).   Dust.   Allergens.   Pollutants and many other irritants.  The cells lining the bronchial tree are covered with tiny hairs (cilia). These constantly beat upward, away from the lungs, toward the mouth. This keeps the lungs free of pollutants. When these cells become too irritated and are unable to do their job, mucus begins to develop. This causes the characteristic cough of bronchitis. The cough clears the lungs when the cilia are unable to do their job. Without either of these protective mechanisms, the mucus would settle in the lungs. Then you would develop pneumonia. Smoking is a common cause of bronchitis and can contribute to pneumonia. Stopping this habit is the single most important thing you can do to help yourself. TREATMENT   Your caregiver may prescribe an antibiotic if the cough is caused by bacteria. Also, medicines that open up your airways make it easier to breathe. Your caregiver may also recommend or prescribe an expectorant. It will loosen the mucus to be coughed up. Only take over-the-counter or prescription medicines for pain, discomfort, or fever as directed by your caregiver.   Removing whatever causes the problem (smoking, for example) is critical to preventing the problem from getting worse.   Cough suppressants may be prescribed for relief of cough symptoms.   Inhaled medicines may be prescribed to help with symptoms now and to help prevent problems from returning.   For those with recurrent (chronic) bronchitis, there may be a need for steroid medicines.  SEEK IMMEDIATE MEDICAL  CARE IF:   During treatment, you develop more pus-like mucus (purulent sputum).   You have a fever.   Your baby is older than 3 months with a rectal temperature of 102 F (38.9 C) or higher.   Your baby is 53 months old or younger with a rectal temperature of 100.4 F (38 C) or higher.   You become progressively more ill.   You have increased difficulty breathing, wheezing, or shortness of breath.  It is necessary to seek immediate medical care if you are elderly or sick from any other disease. MAKE SURE YOU:   Understand these instructions.   Will watch your condition.   Will get help right away if you are not doing well or get worse.  Document Released: 09/07/2005 Document Revised: 05/20/2011 Document Reviewed: 07/17/2008 [facility] Patient Information 2012 [place], [place].

## 2011-09-11 NOTE — Progress Notes (Signed)
  Subjective:    Patient ID: Jason [name], male    DOB: 1935/10/20, 75 y.o.   MRN: [id number]  HPI 75 year old white male, nonsmoker, patient of Dr. Timoteo Gaul, and in with complaints of cough, chest congestion has been one of the 4 days. Hasn't taken over-the-counter NyQuil and Alka-Seltzer plus with no relief. Denies any sick contacts. Symptoms stable.  Review of Systems  Constitutional: Negative.   HENT: Positive for congestion.   Eyes: Negative.   Respiratory: Positive for cough.   Cardiovascular: Negative.   Skin: Negative.   Neurological: Negative.    Past Medical History  Diagnosis Date  . Ankylosing spondylitis   . Diverticulosis of colon (without mention of hemorrhage)   . Unspecified hearing loss   . Hypertension   . Rheumatoid arthritis     History   Social History  . Marital Status: Married    Spouse Name: N/A    Number of Children: N/A  . Years of Education: N/A   Occupational History  . retired    Social History Main Topics  . Smoking status: Never Smoker   . Smokeless tobacco: Not on file  . Alcohol Use: No  . Drug Use: No  . Sexually Active: Not on file   Other Topics Concern  . Not on file   Social History Narrative  . No narrative on file    Past Surgical History  Procedure Date  . Revision total hip arthroplasty 1978/1992    Family History  Problem Relation Age of Onset  . Heart disease Mother   . Diabetes Mother   . Diabetes Father   . Diabetes    . Heart disease      No Known Allergies  Current Outpatient Prescriptions on File Prior to Visit  Medication Sig Dispense Refill  . hydrochlorothiazide 25 MG tablet Take 1 tablet (25 mg total) by mouth daily. TAKE 1 TABLET BY MOUTH ONCE A DAY  30 tablet  11  . nabumetone (RELAFEN) 750 MG tablet Take 750 mg by mouth 2 (two) times daily.         No current facility-administered medications on file prior to visit.    BP 140/90  Temp(Src) 98.8 F (37.1 C) (Oral)  Wt 210 lb (95.255  kg)chart   and and he Objective:   Physical Exam  Constitutional: He is oriented to person, place, and time. He appears well-developed and well-nourished.  HENT:  Right Ear: External ear normal.  Left Ear: External ear normal.  Neck: Normal range of motion. Neck supple.  Cardiovascular: Normal rate, regular rhythm and normal heart sounds.   Pulmonary/Chest: Effort normal and breath sounds normal.  Musculoskeletal: Normal range of motion.  Neurological: He is alert and oriented to person, place, and time.  Skin: Skin is warm and dry.  Psychiatric: He has a normal mood and affect.          Assessment & Plan:  Assessment: Upper respiratory infection, likely viral  Plan: Depo-Medrol 80 mg IM x1. Robitussin-AC 1 teaspoon 3 times a day when necessary cough. Warned of drowsiness. Mucinex OTC as directed. Call if symptoms worsen or persists. Rest. Drink clear fluids.  scratch that in to recheck as scheduled, and when necessary

## 2012-01-21 ENCOUNTER — Other Ambulatory Visit: Payer: Self-pay | Admitting: *Deleted

## 2012-01-21 DIAGNOSIS — I10 Essential (primary) hypertension: Secondary | ICD-10-CM

## 2012-01-21 MED ORDER — HYDROCHLOROTHIAZIDE 25 MG PO TABS: 25.0000 mg | ORAL_TABLET | Freq: Every day | ORAL | Status: DC

## 2012-02-26 IMAGING — CR DG PORTABLE PELVIS
1 series · 2 of 2 positions shown · non-contrast
Comparison: Pelvis radiograph 07/27/06.

CLINICAL DATA: Status post left total hip arthroplasty

PORTABLE PELVIS

[Series 1: AP · U · 2 of 2 slices shown]
[im 1/2]
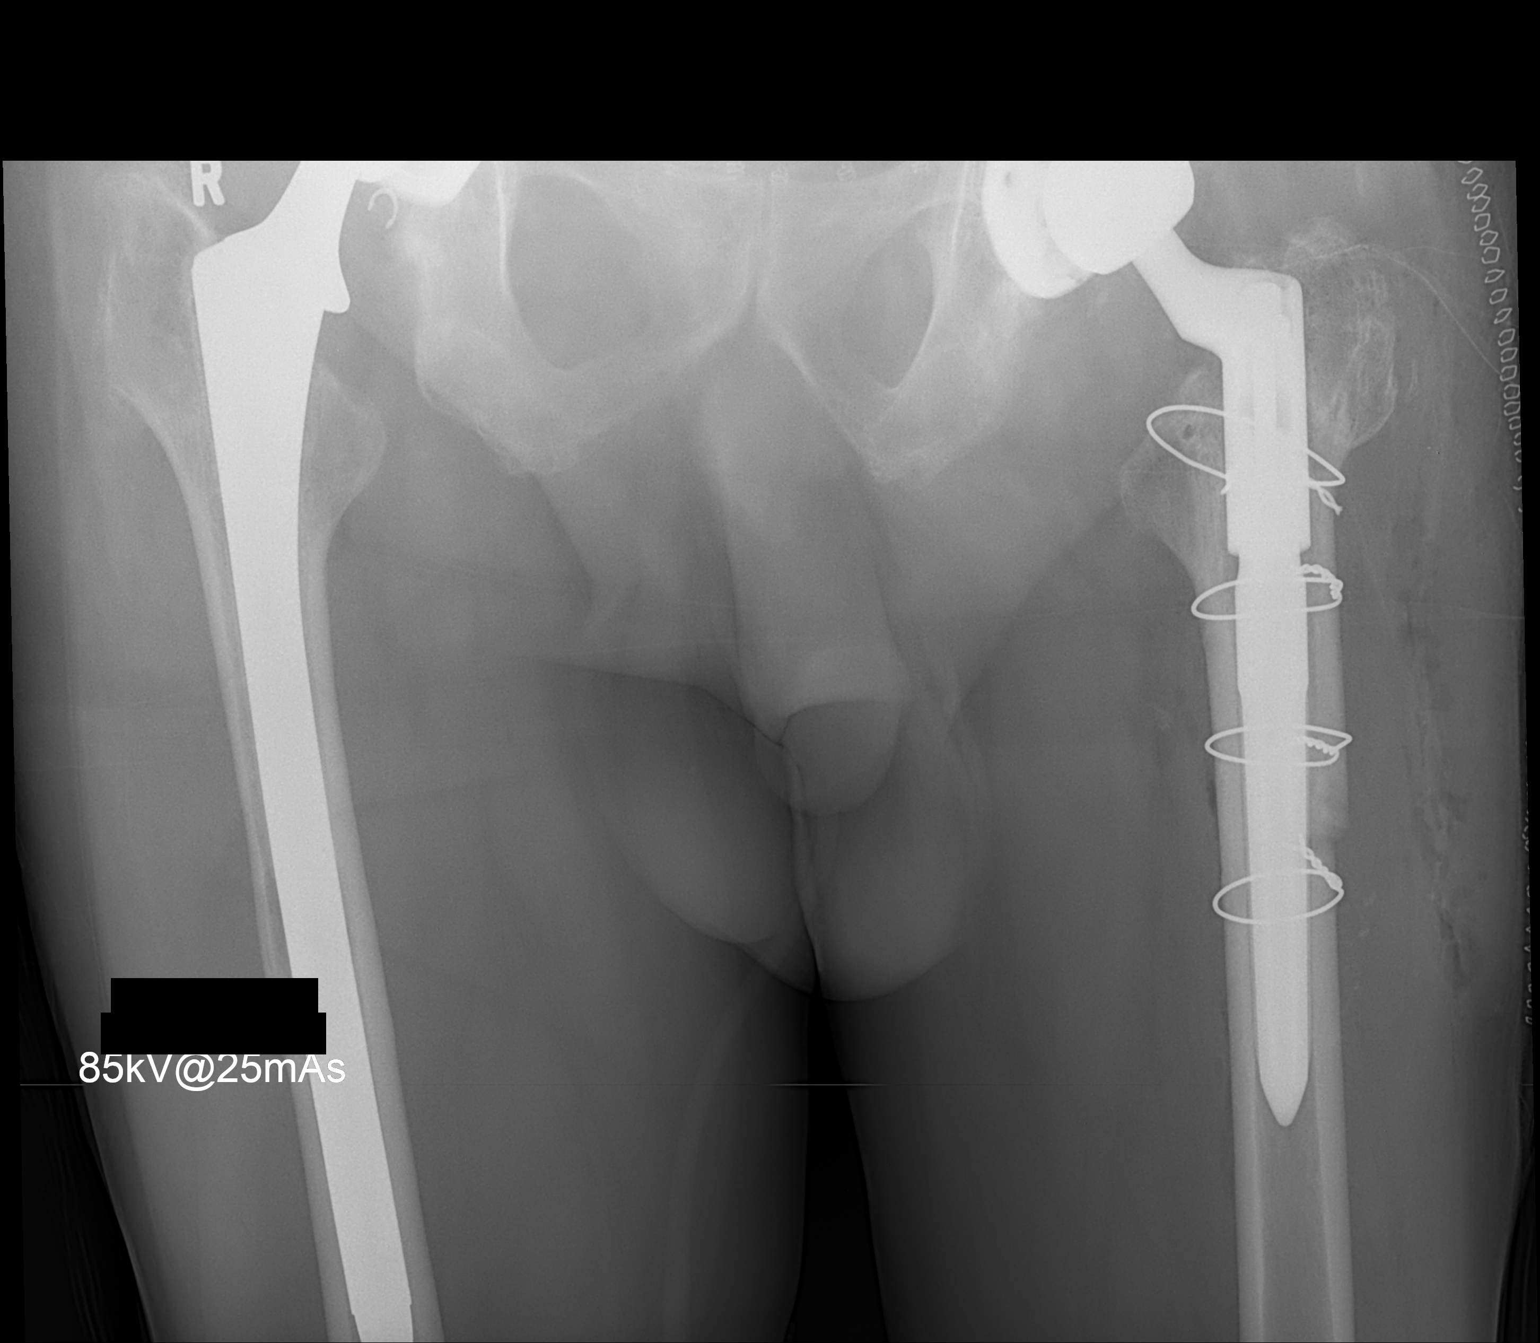
[im 2/2]
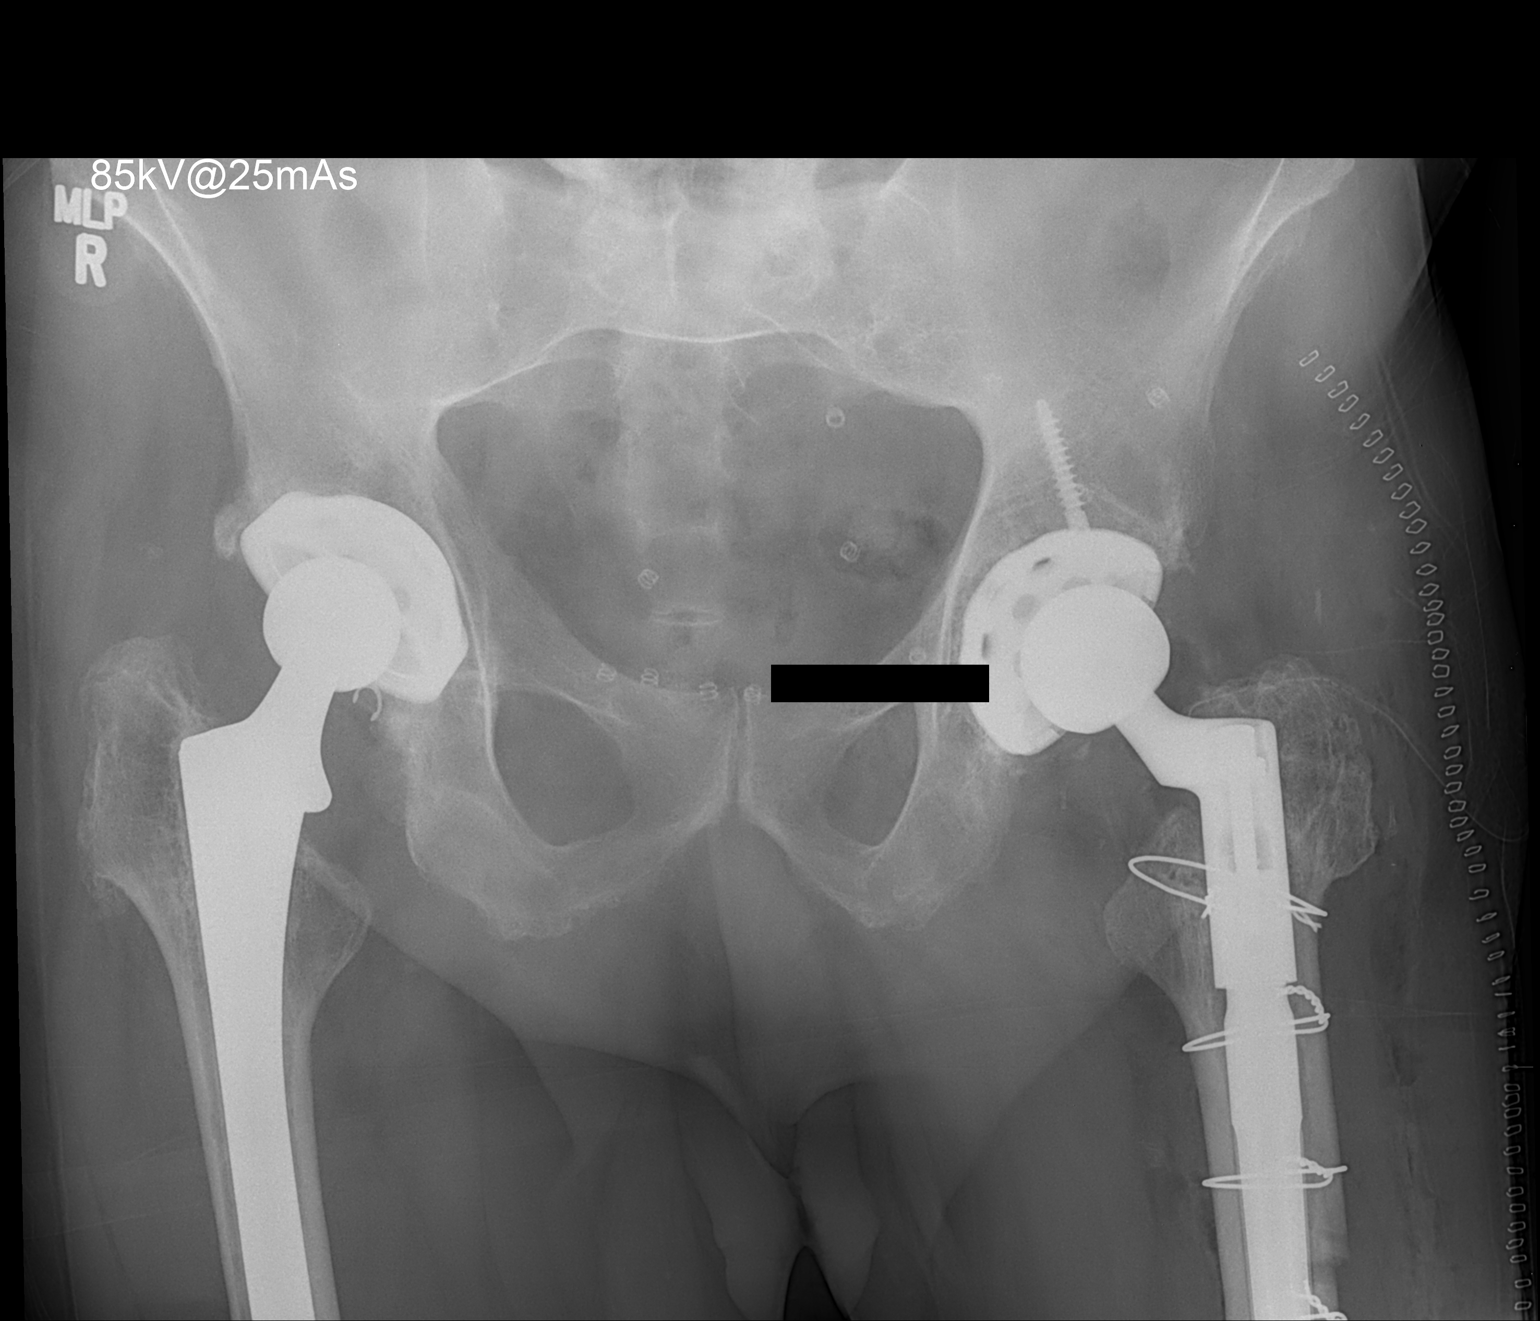

[2 of 2 positions shown; findings below may reference images not displayed]

Digitized imagesfrom left
hip aspiration 06/12/2011 at at [HOSPITAL] [facility]. [facility]HOSPITAL].
FINDINGS: Interval replacement of left total hip arthroplasty is
noted with overlying skin staples and drain in place.  Subcutaneous
gas noted.   Bones are demineralized.  There is cortical
discontinuity at the lateral aspect of the proximal left femoral
diaphysis just proximal to the distal most cerclage wire.  Right
total hip arthroplasty again noted.
IMPRESSION: Cortical discontinuity of the proximal left femur as described
above, which may represent fracture.  Correlate with presumed
outside prior preoperative imaging.  No evidence for hardware
failure.

## 2012-07-01 ENCOUNTER — Encounter: Payer: Self-pay | Admitting: Internal Medicine

## 2012-08-01 ENCOUNTER — Encounter: Payer: Self-pay | Admitting: Internal Medicine

## 2012-08-05 ENCOUNTER — Ambulatory Visit (INDEPENDENT_AMBULATORY_CARE_PROVIDER_SITE_OTHER): Payer: Medicare Other | Admitting: Internal Medicine

## 2012-08-05 ENCOUNTER — Encounter: Payer: Self-pay | Admitting: Internal Medicine

## 2012-08-05 VITALS — BP 160/92 | HR 71 | Temp 97.9°F | Wt 209.0 lb

## 2012-08-05 DIAGNOSIS — R0609 Other forms of dyspnea: Secondary | ICD-10-CM

## 2012-08-05 DIAGNOSIS — I10 Essential (primary) hypertension: Secondary | ICD-10-CM

## 2012-08-05 DIAGNOSIS — R0989 Other specified symptoms and signs involving the circulatory and respiratory systems: Secondary | ICD-10-CM

## 2012-08-05 DIAGNOSIS — R0600 Dyspnea, unspecified: Secondary | ICD-10-CM

## 2012-08-07 NOTE — Progress Notes (Signed)
Patient ID: Jason [name], male   DOB: 1936/05/12, 76 y.o.   MRN: [id number] Progressive DOE for 6 months. No cp No exertional diaphoresis  Past Medical History  Diagnosis Date  . Ankylosing spondylitis   . Diverticulosis of colon (without mention of hemorrhage)   . Unspecified hearing loss   . Hypertension   . Rheumatoid arthritis     History   Social History  . Marital Status: Married    Spouse Name: N/A    Number of Children: N/A  . Years of Education: N/A   Occupational History  . retired    Social History Main Topics  . Smoking status: Never Smoker   . Smokeless tobacco: Not on file  . Alcohol Use: No  . Drug Use: No  . Sexually Active: Not on file   Other Topics Concern  . Not on file   Social History Narrative  . No narrative on file    Past Surgical History  Procedure Date  . Revision total hip arthroplasty 1978/1992    Family History  Problem Relation Age of Onset  . Heart disease Mother   . Diabetes Mother   . Diabetes Father   . Diabetes    . Heart disease      No Known Allergies  Current Outpatient Prescriptions on File Prior to Visit  Medication Sig Dispense Refill  . hydrochlorothiazide (HYDRODIURIL) 25 MG tablet Take 1 tablet (25 mg total) by mouth daily. TAKE 1 TABLET BY MOUTH ONCE A DAY  30 tablet  11     patient denies chest pain, shortness of breath, orthopnea. Denies lower extremity edema, abdominal pain, change in appetite, change in bowel movements. Patient denies rashes, musculoskeletal complaints. No other specific complaints in a complete review of systems.   BP 160/92  Pulse 71  Temp 97.9 F (36.6 C) (Oral)  Wt 209 lb (94.802 kg)  SpO2 96%  well-developed well-nourished male in no acute distress. HEENT exam atraumatic, normocephalic, neck supple without jugular venous distention. Chest clear to auscultation cardiac exam S1-S2 are regular. Abdominal exam overweight with bowel sounds, soft and nontender. Extremities no  edema. Neurologic exam is alert with a normal gait. Repeat bp= 134/76  A/P: progressive DOE- check ekg-normal Schedule stress myoview

## 2012-08-16 ENCOUNTER — Ambulatory Visit (HOSPITAL_COMMUNITY): Payer: Medicare Other | Attending: Internal Medicine | Admitting: Radiology

## 2012-08-16 VITALS — BP 141/80 | Ht 71.0 in | Wt 200.0 lb

## 2012-08-16 DIAGNOSIS — R0989 Other specified symptoms and signs involving the circulatory and respiratory systems: Secondary | ICD-10-CM | POA: Insufficient documentation

## 2012-08-16 DIAGNOSIS — I491 Atrial premature depolarization: Secondary | ICD-10-CM

## 2012-08-16 DIAGNOSIS — I10 Essential (primary) hypertension: Secondary | ICD-10-CM

## 2012-08-16 DIAGNOSIS — R0609 Other forms of dyspnea: Secondary | ICD-10-CM | POA: Insufficient documentation

## 2012-08-16 DIAGNOSIS — R0600 Dyspnea, unspecified: Secondary | ICD-10-CM

## 2012-08-16 MED ORDER — TECHNETIUM TC 99M SESTAMIBI GENERIC - CARDIOLITE
30.0000 | Freq: Once | INTRAVENOUS | Status: AC | PRN
  Administered 2012-08-16: 30 via INTRAVENOUS

## 2012-08-16 MED ORDER — TECHNETIUM TC 99M SESTAMIBI GENERIC - CARDIOLITE
10.0000 | Freq: Once | INTRAVENOUS | Status: AC | PRN
  Administered 2012-08-16: 10 via INTRAVENOUS

## 2012-08-16 NOTE — Progress Notes (Signed)
[facility] SITE 3 NUCLEAR MED [address] [id number] [place] [address] [phone]  Cardiology Nuclear Med Study  Jason [name] is a 76 y.o. male     MRN : [id number]     DOB: May 17, 1936  Procedure Date: 08/16/2012  Nuclear Med Background Indication for Stress Test:  Evaluation for Ischemia History:  12/06/08 MPS: NL EF: 59% Cardiac Risk Factors: Family History - CAD and Hypertension  Symptoms:  DOE, Fatigue and Fatigue with Exertion   Nuclear Pre-Procedure Caffeine/Decaff Intake:  None > 12 hrs NPO After: 7:30pm   Lungs:  clear O2 Sat: 96% on room air. IV 0.9% NS with Angio Cath:  22g  IV Site: R Antecubital x 1, tolerated well IV Started by:  Irean Hong, RN  Chest Size (in):  44 Cup Size: n/a  Height: 5\' 11"  (1.803 m)  Weight:  200 lb (90.719 kg)  BMI:  Body mass index is 27.89 kg/(m^2). Tech Comments:  This patient became very sob, fatigued, and could only walk 4 minutes. Positive changes and pictures checked.    Nuclear Med Study 1 or 2 day study: 1 day  Stress Test Type:  Stress  Reading MD: Marca Ancona, MD  Order Authorizing Provider:  Birdie Sons, MD  Resting Radionuclide: Technetium 80m Sestamibi  Resting Radionuclide Dose: 10.5 mCi   Stress Radionuclide:  Technetium 56m Sestamibi  Stress Radionuclide Dose: 29.4 mCi           Stress Protocol Rest HR: 57 Stress HR: 139  Rest BP: 141/80 Stress BP: 219/97  Exercise Time (min): 4:00 METS: 5.80   Predicted Max HR: 146 bpm % Max HR: 95.21 bpm Rate Pressure Product: 63875   Dose of Adenosine (mg):  n/a Dose of Lexiscan: n/a mg  Dose of Atropine (mg): n/a Dose of Dobutamine: n/a mcg/kg/min (at max HR)  Stress Test Technologist: Milana Na, EMT-P  Nuclear Technologist:  Domenic Polite, CNMT     Rest Procedure:  Myocardial perfusion imaging was performed at rest 45 minutes following the intravenous administration of Technetium 9m Sestamibi. Rest ECG: NSR - Normal  EKG  Stress Procedure:  The patient performed treadmill exercise using a Bruce  Protocol for 4:00 minutes. The patient stopped due to fatigue, sob,  and denied any chest pain.  There were + significant ST-T wave changes and occ pacs.  Technetium 45m Sestamibi was injected at peak exercise and myocardial perfusion imaging was performed after a brief delay. Stress ECG: (+) changes  QPS Raw Data Images:  Normal; no motion artifact; normal heart/lung ratio. Stress Images:  Small, mild basal inferior perfusion defect.  Rest Images:  Small, mild basal inferior perfusion defect.  Subtraction (SDS):  Fixed small, mild basal inferior perfusion defect.  Transient Ischemic Dilatation (Normal <1.22):  1.19 Lung/Heart Ratio (Normal <0.45):  0.24  Quantitative Gated Spect Images QGS EDV:  101 ml QGS ESV:  35 ml  Impression Exercise Capacity:  Poor exercise capacity. BP Response:  Hypertensive blood pressure response. Clinical Symptoms:  Dyspnea.  ECG Impression:  No significant ST segment change suggestive of ischemia. Comparison with Prior Nuclear Study: No images to compare  Overall Impression:  Low risk stress nuclear study.  Fixed small mild basal inferior perfusion defect with normal wall motion likely represents diaphragmatic attenuation.   LV Ejection Fraction: 65%.  LV Wall Motion:  NL LV Function; NL Wall Motion  Marca Ancona 08/16/2012

## 2012-08-19 NOTE — Progress Notes (Signed)
Quick Note:  Called and spoke with pt and pt is aware. ______ 

## 2012-08-19 NOTE — Progress Notes (Signed)
Quick Note:  Left a message for return call. ______ 

## 2012-09-22 ENCOUNTER — Other Ambulatory Visit (INDEPENDENT_AMBULATORY_CARE_PROVIDER_SITE_OTHER): Payer: Medicare Other

## 2012-09-22 DIAGNOSIS — Z0000 Encounter for general adult medical examination without abnormal findings: Secondary | ICD-10-CM

## 2012-09-22 LAB — BASIC METABOLIC PANEL
GFR: 74.91 mL/min (ref >60.00)
Potassium: 4.5 mEq/L (ref 3.5–5.1)
Sodium: 137 mEq/L (ref 135–145)

## 2012-09-22 LAB — LIPID PANEL
Cholesterol: 186 mg/dL (ref 0–200)
HDL: 34.2 mg/dL — ABNORMAL LOW (ref >39.00)
VLDL: 28 mg/dL (ref 0.0–40.0)

## 2012-09-22 LAB — POCT URINALYSIS DIPSTICK
Blood, UA: NEGATIVE
Ketones, UA: NEGATIVE
Protein, UA: NEGATIVE
Spec Grav, UA: 1.02
pH, UA: 6.5

## 2012-09-22 LAB — CBC WITH DIFFERENTIAL/PLATELET
Eosinophils Absolute: 0.2 10*3/uL (ref 0.0–0.7)
Eosinophils Relative: 3.1 % (ref 0.0–5.0)
MCHC: 33.9 g/dL (ref 30.0–36.0)
MCV: 92.4 fl (ref 78.0–100.0)
Monocytes Absolute: 0.4 10*3/uL (ref 0.1–1.0)
Neutrophils Relative %: 59.9 % (ref 43.0–77.0)
Platelets: 221 10*3/uL (ref 150.0–400.0)
WBC: 5.2 10*3/uL (ref 4.5–10.5)

## 2012-09-22 LAB — HEPATIC FUNCTION PANEL
ALT: 15 U/L (ref 0–53)
Alkaline Phosphatase: 71 U/L (ref 39–117)
Bilirubin, Direct: 0.1 mg/dL (ref 0.0–0.3)
Total Protein: 7.1 g/dL (ref 6.0–8.3)

## 2012-09-27 ENCOUNTER — Ambulatory Visit (INDEPENDENT_AMBULATORY_CARE_PROVIDER_SITE_OTHER): Payer: Medicare Other | Admitting: Internal Medicine

## 2012-09-27 ENCOUNTER — Encounter: Payer: Self-pay | Admitting: Internal Medicine

## 2012-09-27 VITALS — BP 154/82 | HR 72 | Temp 98.0°F | Ht 71.0 in | Wt 210.0 lb

## 2012-09-27 DIAGNOSIS — Z23 Encounter for immunization: Secondary | ICD-10-CM

## 2012-09-27 MED ORDER — CALCIUM POLYCARBOPHIL 625 MG PO TABS: ORAL_TABLET | ORAL | Status: DC

## 2012-09-27 NOTE — Progress Notes (Signed)
Patient ID: Jason [name], male   DOB: 1936/01/01, 77 y.o.   MRN: [id number] cpx Has noted some stool seepage during the day Once some moles on face evaluated  Past Medical History  Diagnosis Date  . Ankylosing spondylitis   . Diverticulosis of colon (without mention of hemorrhage)   . Unspecified hearing loss   . Hypertension   . Rheumatoid arthritis     History   Social History  . Marital Status: Married    Spouse Name: N/A    Number of Children: N/A  . Years of Education: N/A   Occupational History  . retired    Social History Main Topics  . Smoking status: Never Smoker   . Smokeless tobacco: Not on file  . Alcohol Use: No  . Drug Use: No  . Sexually Active: Not on file   Other Topics Concern  . Not on file   Social History Narrative  . No narrative on file    Past Surgical History  Procedure Date  . Revision total hip arthroplasty 1978/1992    Family History  Problem Relation Age of Onset  . Heart disease Mother   . Diabetes Mother   . Diabetes Father   . Diabetes    . Heart disease      No Known Allergies  Current Outpatient Prescriptions on File Prior to Visit  Medication Sig Dispense Refill  . hydrochlorothiazide (HYDRODIURIL) 25 MG tablet Take 1 tablet (25 mg total) by mouth daily. TAKE 1 TABLET BY MOUTH ONCE A DAY  30 tablet  11     patient denies chest pain, shortness of breath, orthopnea. Denies lower extremity edema, abdominal pain, change in appetite, change in bowel movements. Patient denies rashes, musculoskeletal complaints. No other specific complaints in a complete review of systems.   BP 154/82  Pulse 72  Temp 98 F (36.7 C) (Oral)  Ht 5\' 11"  (1.803 m)  Wt 210 lb (95.255 kg)  BMI 29.29 kg/m2  well-developed well-nourished male in no acute distress. HEENT exam atraumatic, normocephalic, neck supple without jugular venous distention. Chest clear to auscultation cardiac exam S1-S2 are regular. Abdominal exam overweight with bowel  sounds, soft and nontender. Extremities no edema. Neurologic exam is alert with a normal gait.   A/p- well visit- health maint UTD ? Fecal incontinence-- doubt of significance- trial increased fiber

## 2013-01-30 ENCOUNTER — Other Ambulatory Visit: Payer: Self-pay | Admitting: Internal Medicine

## 2013-07-27 ENCOUNTER — Other Ambulatory Visit: Payer: Self-pay

## 2013-08-20 ENCOUNTER — Encounter: Payer: Self-pay | Admitting: Internal Medicine

## 2013-08-23 ENCOUNTER — Encounter: Payer: Self-pay | Admitting: Family

## 2013-08-23 ENCOUNTER — Ambulatory Visit (HOSPITAL_COMMUNITY)
Admission: RE | Admit: 2013-08-23 | Discharge: 2013-08-23 | Disposition: A | Discharge status: Home / Self Care | Payer: Medicare Other | Source: Ambulatory Visit | Attending: Family | Admitting: Family

## 2013-08-23 ENCOUNTER — Ambulatory Visit (INDEPENDENT_AMBULATORY_CARE_PROVIDER_SITE_OTHER): Payer: Medicare Other | Admitting: Family

## 2013-08-23 VITALS — BP 142/82 | HR 64 | Wt 209.0 lb

## 2013-08-23 DIAGNOSIS — M7731 Calcaneal spur, right foot: Secondary | ICD-10-CM

## 2013-08-23 DIAGNOSIS — M7730 Calcaneal spur, unspecified foot: Secondary | ICD-10-CM | POA: Insufficient documentation

## 2013-08-23 DIAGNOSIS — M79671 Pain in right foot: Secondary | ICD-10-CM

## 2013-08-23 DIAGNOSIS — M79609 Pain in unspecified limb: Secondary | ICD-10-CM

## 2013-08-23 DIAGNOSIS — M259 Joint disorder, unspecified: Secondary | ICD-10-CM | POA: Insufficient documentation

## 2013-08-23 NOTE — Progress Notes (Signed)
   Subjective:    Patient ID: Jason [name], male    DOB: [birth year], 77 y.o.   MRN: [id number]  HPI 77 year old white male, nonsmoker, with a history of hypertension and  rheumatoid arthritis is in today with complaints of right heel pain x6 months and worsening. Pain is 6/10. Worse at night and 1st thing in the am. Hasn't taken Advil that helps his symptoms. Pain is described as a dull ache.   Review of Systems  Constitutional: Negative.   Respiratory: Negative.   Cardiovascular: Negative.   Musculoskeletal:       Right heel pain  Skin: Negative.   Neurological: Negative.   Psychiatric/Behavioral: Negative.    Past Medical History  Diagnosis Date  . Ankylosing spondylitis   . Diverticulosis of colon (without mention of hemorrhage)   . Unspecified hearing loss   . Hypertension   . Rheumatoid arthritis(714.0)     History   Social History  . Marital Status: Married    Spouse Name: N/A    Number of Children: N/A  . Years of Education: N/A   Occupational History  . retired    Social History Main Topics  . Smoking status: Never Smoker   . Smokeless tobacco: Not on file  . Alcohol Use: No  . Drug Use: No  . Sexual Activity: Not on file   Other Topics Concern  . Not on file   Social History Narrative  . No narrative on file    Past Surgical History  Procedure Laterality Date  . Revision total hip arthroplasty  1978/1992    Family History  Problem Relation Age of Onset  . Heart disease Mother   . Diabetes Mother   . Diabetes Father   . Diabetes    . Heart disease      No Known Allergies  Current Outpatient Prescriptions on File Prior to Visit  Medication Sig Dispense Refill  . aspirin 81 MG tablet Take 162 mg by mouth daily.      . hydrochlorothiazide (HYDRODIURIL) 25 MG tablet TAKE 1 TABLET EVERY DAY  30 tablet  11  . polycarbophil (FIBERCON) 625 MG tablet Take 3 daily       No current facility-administered medications on file prior to visit.     BP 142/82  Pulse 64  Wt 209 lb (94.802 kg)chart    Objective:   Physical Exam  Constitutional: He is oriented to person, place, and time. He appears well-developed and well-nourished.  Neck: Neck supple.  Cardiovascular: Normal rate, regular rhythm and normal heart sounds.   Pulmonary/Chest: Effort normal and breath sounds normal.  Musculoskeletal: He exhibits tenderness. He exhibits no edema.  Right heel pain tender to palpation of the posterior aspect of the heel. No swelling or redness.  Neurological: He is oriented to person, place, and time.  Skin: Skin is warm and dry.  Psychiatric: He has a normal mood and affect.          Assessment & Plan:  Assessment:  1. Right Heel Pain  2. Probable heel spur 3. Hypertension  Plan: X-ray of the right foot. Aleve as needed for pain. We'll consider a consult with podiatry pending the results of the x-ray. Exercises given.  Plan: Xray of the right foot to r/o heel spur.

## 2013-08-23 NOTE — Patient Instructions (Signed)
Plantar Fasciitis (Heel Spur Syndrome)  with Rehab  The plantar fascia is a fibrous, ligament-like, soft-tissue structure that spans the bottom of the foot. Plantar fasciitis is a condition that causes pain in the foot due to inflammation of the tissue.  SYMPTOMS   · Pain and tenderness on the underneath side of the foot.  · Pain that worsens with standing or walking.  CAUSES   Plantar fasciitis is caused by irritation and injury to the plantar fascia on the underneath side of the foot. Common mechanisms of injury include:  · Direct trauma to bottom of the foot.  · Damage to a small nerve that runs under the foot where the main fascia attaches to the heel bone.  · Stress placed on the plantar fascia due to bone spurs.  RISK INCREASES WITH:   · Activities that place stress on the plantar fascia (running, jumping, pivoting, or cutting).  · Poor strength and flexibility.  · Improperly fitted shoes.  · Tight calf muscles.  · Flat feet.  · Failure to warm-up properly before activity.  · Obesity.  PREVENTION  · Warm up and stretch properly before activity.  · Allow for adequate recovery between workouts.  · Maintain physical fitness:  · Strength, flexibility, and endurance.  · Cardiovascular fitness.  · Maintain a health body weight.  · Avoid stress on the plantar fascia.  · Wear properly fitted shoes, including arch supports for individuals who have flat feet.  PROGNOSIS   If treated properly, then the symptoms of plantar fasciitis usually resolve without surgery. However, occasionally surgery is necessary.  RELATED COMPLICATIONS   · Recurrent symptoms that may result in a chronic condition.  · Problems of the lower back that are caused by compensating for the injury, such as limping.  · Pain or weakness of the foot during push-off following surgery.  · Chronic inflammation, scarring, and partial or complete fascia tear, occurring more often from repeated injections.  TREATMENT   Treatment initially involves the use of  ice and medication to help reduce pain and inflammation. The use of strengthening and stretching exercises may help reduce pain with activity, especially stretches of the Achilles tendon. These exercises may be performed at home or with a therapist. Your caregiver may recommend that you use heel cups of arch supports to help reduce stress on the plantar fascia. Occasionally, corticosteroid injections are given to reduce inflammation. If symptoms persist for greater than 6 months despite non-surgical (conservative), then surgery may be recommended.   MEDICATION   · If pain medication is necessary, then nonsteroidal anti-inflammatory medications, such as aspirin and ibuprofen, or other minor pain relievers, such as acetaminophen, are often recommended.  · Do not take pain medication within 7 days before surgery.  · Prescription pain relievers may be given if deemed necessary by your caregiver. Use only as directed and only as much as you need.  · Corticosteroid injections may be given by your caregiver. These injections should be reserved for the most serious cases, because they may only be given a certain number of times.  HEAT AND COLD  · Cold treatment (icing) relieves pain and reduces inflammation. Cold treatment should be applied for 10 to 15 minutes every 2 to 3 hours for inflammation and pain and immediately after any activity that aggravates your symptoms. Use ice packs or massage the area with a piece of ice (ice massage).  · Heat treatment may be used prior to performing the stretching and strengthening activities prescribed   by your caregiver, physical therapist, or athletic trainer. Use a heat pack or soak the injury in warm water.  SEEK IMMEDIATE MEDICAL CARE IF:  · Treatment seems to offer no benefit, or the condition worsens.  · Any medications produce adverse side effects.  EXERCISES  RANGE OF MOTION (ROM) AND STRETCHING EXERCISES - Plantar Fasciitis (Heel Spur Syndrome)  These exercises may help you  when beginning to rehabilitate your injury. Your symptoms may resolve with or without further involvement from your physician, physical therapist or athletic trainer. While completing these exercises, remember:   · Restoring tissue flexibility helps normal motion to return to the joints. This allows healthier, less painful movement and activity.  · An effective stretch should be held for at least 30 seconds.  · A stretch should never be painful. You should only feel a gentle lengthening or release in the stretched tissue.  RANGE OF MOTION - Toe Extension, Flexion  · Sit with your right / left leg crossed over your opposite knee.  · Grasp your toes and gently pull them back toward the top of your foot. You should feel a stretch on the bottom of your toes and/or foot.  · Hold this stretch for __________ seconds.  · Now, gently pull your toes toward the bottom of your foot. You should feel a stretch on the top of your toes and or foot.  · Hold this stretch for __________ seconds.  Repeat __________ times. Complete this stretch __________ times per day.   RANGE OF MOTION - Ankle Dorsiflexion, Active Assisted  · Remove shoes and sit on a chair that is preferably not on a carpeted surface.  · Place right / left foot under knee. Extend your opposite leg for support.  · Keeping your heel down, slide your right / left foot back toward the chair until you feel a stretch at your ankle or calf. If you do not feel a stretch, slide your bottom forward to the edge of the chair, while still keeping your heel down.  · Hold this stretch for __________ seconds.  Repeat __________ times. Complete this stretch __________ times per day.   STRETCH  Gastroc, Standing  · Place hands on wall.  · Extend right / left leg, keeping the front knee somewhat bent.  · Slightly point your toes inward on your back foot.  · Keeping your right / left heel on the floor and your knee straight, shift your weight toward the wall, not allowing your back to  arch.  · You should feel a gentle stretch in the right / left calf. Hold this position for __________ seconds.  Repeat __________ times. Complete this stretch __________ times per day.  STRETCH  Soleus, Standing  · Place hands on wall.  · Extend right / left leg, keeping the other knee somewhat bent.  · Slightly point your toes inward on your back foot.  · Keep your right / left heel on the floor, bend your back knee, and slightly shift your weight over the back leg so that you feel a gentle stretch deep in your back calf.  · Hold this position for __________ seconds.  Repeat __________ times. Complete this stretch __________ times per day.  STRETCH  Gastrocsoleus, Standing   Note: This exercise can place a lot of stress on your foot and ankle. Please complete this exercise only if specifically instructed by your caregiver.   · Place the ball of your right / left foot on a step, keeping   your other foot firmly on the same step.  · Hold on to the wall or a rail for balance.  · Slowly lift your other foot, allowing your body weight to press your heel down over the edge of the step.  · You should feel a stretch in your right / left calf.  · Hold this position for __________ seconds.  · Repeat this exercise with a slight bend in your right / left knee.  Repeat __________ times. Complete this stretch __________ times per day.   STRENGTHENING EXERCISES - Plantar Fasciitis (Heel Spur Syndrome)   These exercises may help you when beginning to rehabilitate your injury. They may resolve your symptoms with or without further involvement from your physician, physical therapist or athletic trainer. While completing these exercises, remember:   · Muscles can gain both the endurance and the strength needed for everyday activities through controlled exercises.  · Complete these exercises as instructed by your physician, physical therapist or athletic trainer. Progress the resistance and repetitions only as guided.  STRENGTH - Towel  Curls  · Sit in a chair positioned on a non-carpeted surface.  · Place your foot on a towel, keeping your heel on the floor.  · Pull the towel toward your heel by only curling your toes. Keep your heel on the floor.  · If instructed by your physician, physical therapist or athletic trainer, add ____________________ at the end of the towel.  Repeat __________ times. Complete this exercise __________ times per day.  STRENGTH - Ankle Inversion  · Secure one end of a rubber exercise band/tubing to a fixed object (table, pole). Loop the other end around your foot just before your toes.  · Place your fists between your knees. This will focus your strengthening at your ankle.  · Slowly, pull your big toe up and in, making sure the band/tubing is positioned to resist the entire motion.  · Hold this position for __________ seconds.  · Have your muscles resist the band/tubing as it slowly pulls your foot back to the starting position.  Repeat __________ times. Complete this exercises __________ times per day.   Document Released: 09/07/2005 Document Revised: 11/30/2011 Document Reviewed: 12/20/2008  [employer]® Patient Information ©2014 [employer].

## 2013-08-30 ENCOUNTER — Telehealth: Payer: Self-pay | Admitting: Internal Medicine

## 2013-08-30 DIAGNOSIS — M7731 Calcaneal spur, right foot: Secondary | ICD-10-CM

## 2013-08-30 NOTE — Telephone Encounter (Addendum)
Pt was seen on 08-23-13 with np. Pt would like a referral to foot surgeon for opinion on heel spur.

## 2013-08-30 NOTE — Telephone Encounter (Signed)
Referral placed for podiatry

## 2013-09-06 ENCOUNTER — Ambulatory Visit (INDEPENDENT_AMBULATORY_CARE_PROVIDER_SITE_OTHER): Payer: Medicare Other

## 2013-09-06 VITALS — BP 146/84 | HR 67 | Resp 18

## 2013-09-06 DIAGNOSIS — M79609 Pain in unspecified limb: Secondary | ICD-10-CM

## 2013-09-06 DIAGNOSIS — M722 Plantar fascial fibromatosis: Secondary | ICD-10-CM

## 2013-09-06 MED ORDER — MELOXICAM 15 MG PO TABS: 15.0000 mg | ORAL_TABLET | Freq: Every day | ORAL | Status: DC

## 2013-09-06 NOTE — Progress Notes (Signed)
   Subjective:    Patient ID: Jason [name], male    DOB: [birth year], 77 y.o.   MRN: [id number]  HPI My right heel hurts on the bottom and been going on for about 6 months and dull pain and hurts to put weight on it and is sore and tender and I saw my medical doctor on Dec 3rd, 2014 and had a x-ray and was told to take aleve    Review of Systems  Constitutional: Negative.   HENT: Negative.   Eyes: Negative.   Respiratory: Positive for shortness of breath.   Cardiovascular: Negative.   Gastrointestinal: Negative.   Endocrine: Negative.   Genitourinary: Negative.   Musculoskeletal:       Difficulty walking   Skin: Negative.   Allergic/Immunologic: Negative.   Neurological: Negative.   Hematological: Negative.   Psychiatric/Behavioral: Negative.        Objective:   Physical Exam Neurovascular status is intact bilateral pedal pulses palpable DP postal for PT plus one over 4 bilateral Refill timed 3-4 seconds all digits no rubor pallor or varicosities noted x-rays are reviewed reveal no inferior calcaneal spurring nonweightbearing x-rays were taken the x-rays did not show any fracture or osseous abnormality posse some slight thickening of fascial structure noted. On palpation of both feet there is a new band of the plantar fascia bilateral a large nodularity or fibromatous-type nodule these are not symptomatically on the left however slowly tender on the right foot. Patient describes a while back he did have a ring pain and shooting pain in both heels however the right hip persistent the left his result. Did use some yard work wearing tennis shoes or sneakers rather than work boots at times patient also is wearing a soft slipper around the house. Orthopedic biomechanical exam rectus foot type tenderness on palpation Magan plantar fascia medial calcaneal tubercle right foot no fractures or other osseous abnormalities dermatologically unremarkable absent hair growth is noted.         Assessment & Plan:  Assessment plantar fasciitis/heel spur syndrome right foot. Fascial strapping is applied. Prescription for Mobic is given. Recommended ice to the heel every evening. Recheck in 2-3 weeks for followup. May recommendations for shoes around the house such as crocs. No barefoot or flimsy shoes. Followup in 2-3 weeks  Alvan Dame DPM

## 2013-09-06 NOTE — Patient Instructions (Signed)

## 2013-10-04 ENCOUNTER — Ambulatory Visit: Payer: Medicare Other

## 2013-11-08 ENCOUNTER — Other Ambulatory Visit: Payer: Self-pay | Admitting: Internal Medicine

## 2013-11-15 ENCOUNTER — Other Ambulatory Visit: Payer: Medicare Other

## 2013-11-15 ENCOUNTER — Other Ambulatory Visit (INDEPENDENT_AMBULATORY_CARE_PROVIDER_SITE_OTHER): Payer: Medicare Other

## 2013-11-15 DIAGNOSIS — Z0000 Encounter for general adult medical examination without abnormal findings: Secondary | ICD-10-CM

## 2013-11-15 DIAGNOSIS — I10 Essential (primary) hypertension: Secondary | ICD-10-CM

## 2013-11-15 LAB — CBC WITH DIFFERENTIAL/PLATELET
BASOS PCT: 0.9 % (ref 0.0–3.0)
Basophils Absolute: 0 10*3/uL (ref 0.0–0.1)
Eosinophils Absolute: 0.2 10*3/uL (ref 0.0–0.7)
Eosinophils Relative: 2.9 % (ref 0.0–5.0)
HEMATOCRIT: 42.6 % (ref 39.0–52.0)
HEMOGLOBIN: 14 g/dL (ref 13.0–17.0)
LYMPHS ABS: 1.8 10*3/uL (ref 0.7–4.0)
LYMPHS PCT: 32.5 % (ref 12.0–46.0)
MCHC: 32.8 g/dL (ref 30.0–36.0)
MCV: 94.8 fl (ref 78.0–100.0)
MONOS PCT: 8.3 % (ref 3.0–12.0)
Monocytes Absolute: 0.5 10*3/uL (ref 0.1–1.0)
NEUTROS ABS: 3.1 10*3/uL (ref 1.4–7.7)
Neutrophils Relative %: 55.4 % (ref 43.0–77.0)
Platelets: 245 10*3/uL (ref 150.0–400.0)
RBC: 4.5 Mil/uL (ref 4.22–5.81)
RDW: 12.9 % (ref 11.5–14.6)
WBC: 5.6 10*3/uL (ref 4.5–10.5)

## 2013-11-15 LAB — POCT URINALYSIS DIPSTICK
Bilirubin, UA: NEGATIVE
Glucose, UA: NEGATIVE
KETONES UA: NEGATIVE
Leukocytes, UA: NEGATIVE
Nitrite, UA: NEGATIVE
PH UA: 6.5
PROTEIN UA: NEGATIVE
RBC UA: NEGATIVE
SPEC GRAV UA: 1.02
UROBILINOGEN UA: 1

## 2013-11-15 LAB — BASIC METABOLIC PANEL
BUN: 18 mg/dL (ref 6–23)
CALCIUM: 9.4 mg/dL (ref 8.4–10.5)
CO2: 29 meq/L (ref 19–32)
Chloride: 103 mEq/L (ref 96–112)
Creatinine, Ser: 1.1 mg/dL (ref 0.4–1.5)
GFR: 71.47 mL/min (ref >60.00)
GLUCOSE: 105 mg/dL — AB (ref 70–99)
POTASSIUM: 5.2 meq/L — AB (ref 3.5–5.1)
SODIUM: 139 meq/L (ref 135–145)

## 2013-11-15 LAB — LIPID PANEL
CHOL/HDL RATIO: 5
Cholesterol: 198 mg/dL (ref 0–200)
HDL: 36.3 mg/dL — ABNORMAL LOW (ref >39.00)
LDL CALC: 129 mg/dL — AB (ref 0–99)
Triglycerides: 164 mg/dL — ABNORMAL HIGH (ref 0.0–149.0)
VLDL: 32.8 mg/dL (ref 0.0–40.0)

## 2013-11-15 LAB — HEPATIC FUNCTION PANEL
ALBUMIN: 4.1 g/dL (ref 3.5–5.2)
ALK PHOS: 76 U/L (ref 39–117)
ALT: 18 U/L (ref 0–53)
AST: 17 U/L (ref 0–37)
Bilirubin, Direct: 0.1 mg/dL (ref 0.0–0.3)
TOTAL PROTEIN: 7.4 g/dL (ref 6.0–8.3)
Total Bilirubin: 0.8 mg/dL (ref 0.3–1.2)

## 2013-11-15 LAB — PSA: PSA: 2.47 ng/mL (ref 0.10–4.00)

## 2013-11-15 LAB — TSH: TSH: 2.14 u[IU]/mL (ref 0.35–5.50)

## 2013-11-21 ENCOUNTER — Encounter: Payer: Self-pay | Admitting: Internal Medicine

## 2013-11-21 ENCOUNTER — Ambulatory Visit (INDEPENDENT_AMBULATORY_CARE_PROVIDER_SITE_OTHER): Payer: Medicare Other | Admitting: Internal Medicine

## 2013-11-21 VITALS — BP 164/94 | HR 72 | Temp 98.1°F | Ht 71.0 in | Wt 211.0 lb

## 2013-11-21 DIAGNOSIS — Z0000 Encounter for general adult medical examination without abnormal findings: Secondary | ICD-10-CM

## 2013-11-21 NOTE — Progress Notes (Signed)
cpx  Past Medical History  Diagnosis Date  . Ankylosing spondylitis   . Diverticulosis of colon (without mention of hemorrhage)   . Unspecified hearing loss   . Hypertension   . Rheumatoid arthritis(714.0)     History   Social History  . Marital Status: Married    Spouse Name: N/A    Number of Children: N/A  . Years of Education: N/A   Occupational History  . retired    Social History Main Topics  . Smoking status: Never Smoker   . Smokeless tobacco: Never Used  . Alcohol Use: No  . Drug Use: No  . Sexual Activity: Not on file   Other Topics Concern  . Not on file   Social History Narrative  . No narrative on file    Past Surgical History  Procedure Laterality Date  . Revision total hip arthroplasty  1978/1992    Family History  Problem Relation Age of Onset  . Heart disease Mother   . Diabetes Mother   . Diabetes Father   . Diabetes    . Heart disease      No Known Allergies  Current Outpatient Prescriptions on File Prior to Visit  Medication Sig Dispense Refill  . aspirin 81 MG tablet Take 162 mg by mouth daily.      . hydrochlorothiazide (HYDRODIURIL) 25 MG tablet TAKE 1 TABLET EVERY DAY  90 tablet  0  . meloxicam (MOBIC) 15 MG tablet Take 1 tablet (15 mg total) by mouth daily.  30 tablet  1  . polycarbophil (FIBERCON) 625 MG tablet Take 3 daily       No current facility-administered medications on file prior to visit.     patient denies chest pain, shortness of breath, orthopnea. Denies lower extremity edema, abdominal pain, change in appetite, change in bowel movements. Patient denies rashes, musculoskeletal complaints. No other specific complaints in a complete review of systems.   BP 164/94  Pulse 72  Temp(Src) 98.1 F (36.7 C) (Oral)  Ht 5\' 11"  (1.803 m)  Wt 211 lb (95.709 kg)  BMI 29.44 kg/m2 Well-developed male in no acute distress. HEENT exam atraumatic, normocephalic, extraocular muscles are intact. Conjunctivae are pink without  exudate. Neck is supple without lymphadenopathy, thyromegaly, jugular venous distention. Chest is clear to auscultation without increased work of breathing. Cardiac exam S1-S2 are regular. The PMI is normal. No significant murmurs or gallops. Abdominal exam active bowel sounds, soft, nontender. No abdominal bruits. Extremities no clubbing cyanosis or edema. Peripheral pulses are normal without bruits. Neurologic exam alert and oriented without any motor or sensory deficits.   Well Visit- health maint UTD  P[lantar fasciitis- discussed conservative treatment plan

## 2013-11-21 NOTE — Progress Notes (Signed)
Pre visit review using our clinic review tool, if applicable. No additional management support is needed unless otherwise documented below in the visit note. 

## 2013-11-22 ENCOUNTER — Encounter: Payer: Medicare Other | Admitting: Internal Medicine

## 2014-03-03 ENCOUNTER — Other Ambulatory Visit: Payer: Self-pay | Admitting: Internal Medicine

## 2014-04-15 IMAGING — CR DG FOOT COMPLETE 3+V*R*
3 series · 3 of 3 positions shown · non-contrast
Comparison: None

CLINICAL DATA: Right heel pain for 6 months

EXAM:
RIGHT FOOT COMPLETE - 3+ VIEW

[t foot ap right]
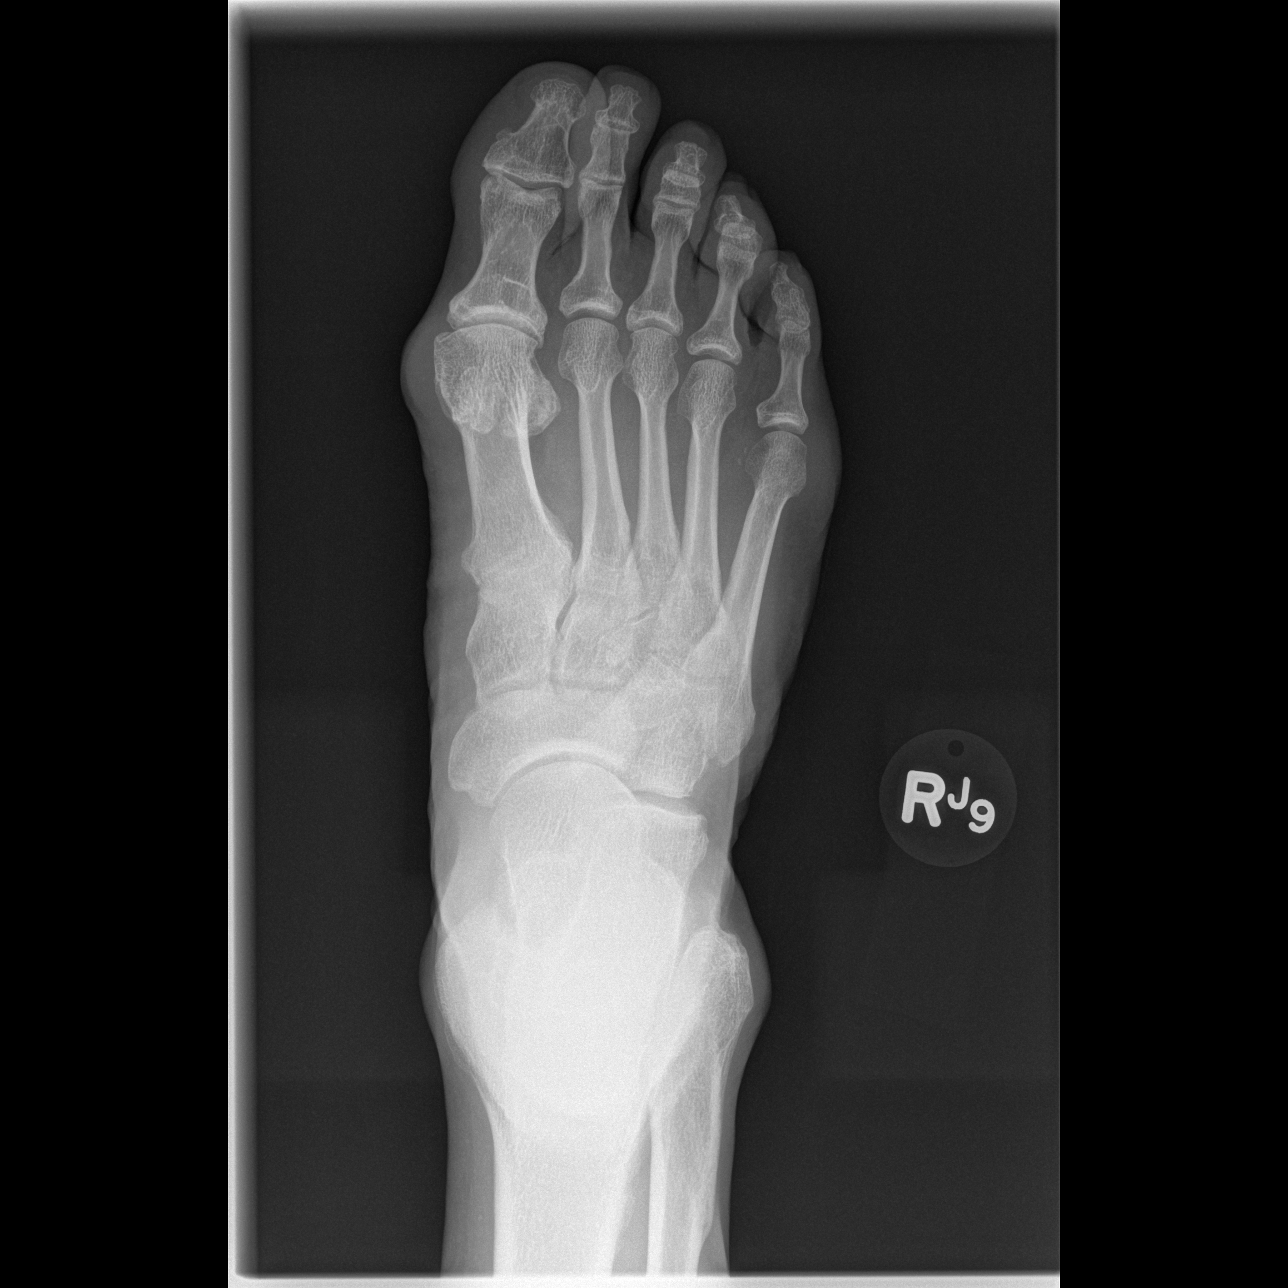

[t foot oblique right]
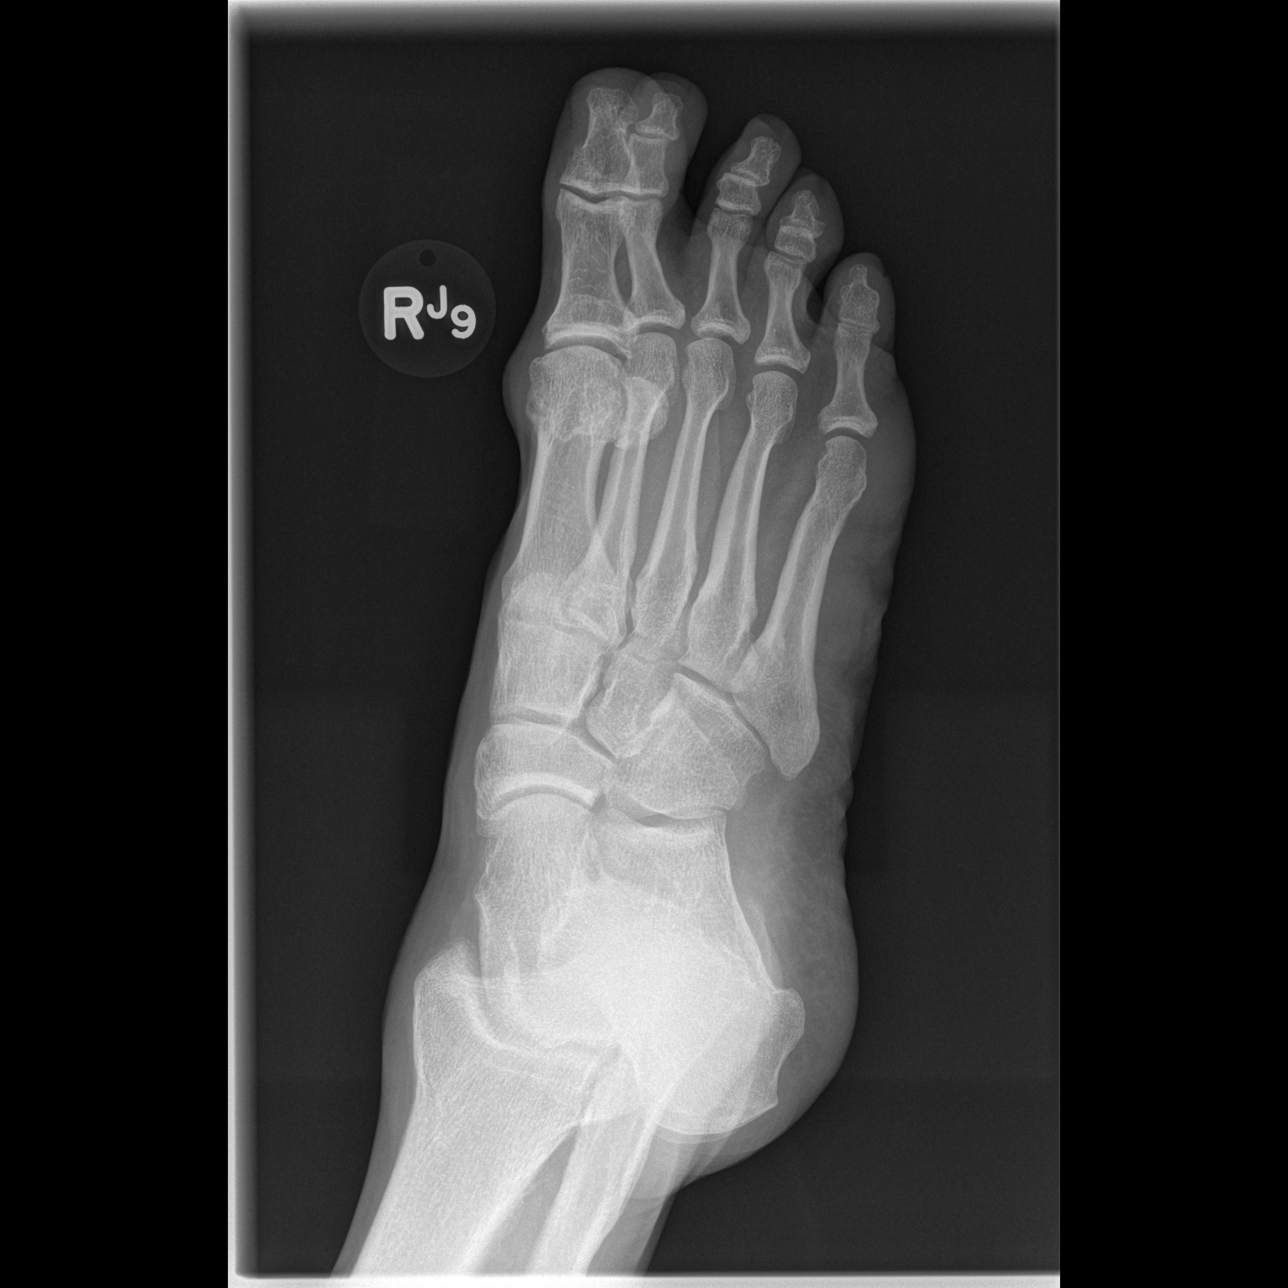

[t foot lat right]
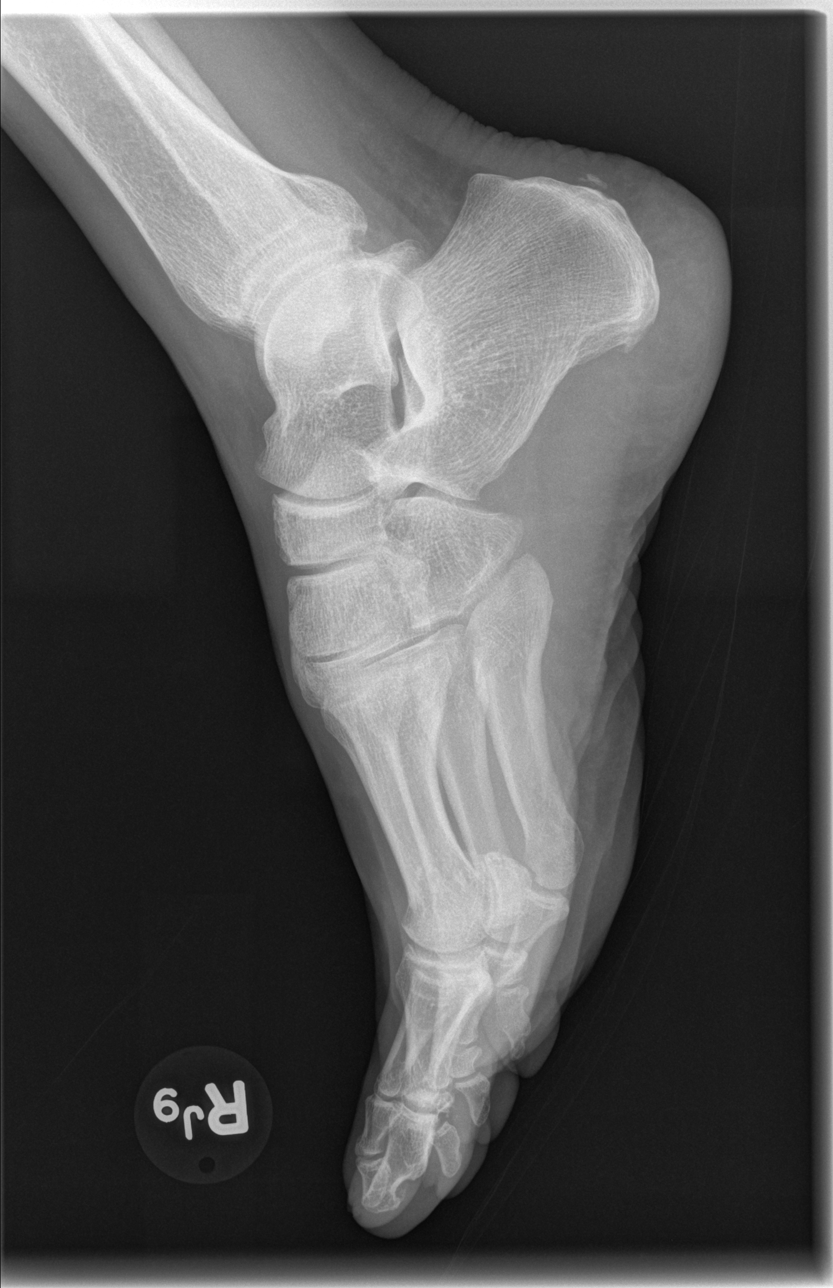

[3 of 3 positions shown; findings below may reference images not displayed]

FINDINGS: Osseous mineralization grossly normal for age.

Degenerative changes 1st MTP joint with minimal bunion deformity.

No acute fracture, dislocation or bone destruction.

Soft tissues unremarkable.

Rudimentary plantar calcaneal spur noted.
IMPRESSION: Mild degenerative changes right 1st MTP joint.

No acute abnormalities.

Tiny plantar calcaneal spur.

## 2014-04-30 ENCOUNTER — Telehealth: Payer: Self-pay | Admitting: Internal Medicine

## 2014-04-30 NOTE — Telephone Encounter (Signed)
Pt requesting to establish care with Dr. Burchette due to Dr. Swords leaving the practice.  Pt was offered an appt to establish with other providers in office who are accepting new patients, pt declined.  Please advise if you can accept this patient or not.  I did explain in detail to pt that you have closed your practice. °

## 2014-05-01 NOTE — Telephone Encounter (Signed)
Cannot take on any new pts at this time.

## 2014-05-02 NOTE — Telephone Encounter (Addendum)
Pt aware and scheduled w/ hunter. °

## 2014-06-05 ENCOUNTER — Other Ambulatory Visit: Payer: Self-pay | Admitting: Internal Medicine

## 2014-06-26 ENCOUNTER — Ambulatory Visit (INDEPENDENT_AMBULATORY_CARE_PROVIDER_SITE_OTHER): Payer: Medicare Other | Admitting: Family Medicine

## 2014-06-26 ENCOUNTER — Ambulatory Visit: Payer: Medicare Other | Admitting: Family Medicine

## 2014-06-26 DIAGNOSIS — Z23 Encounter for immunization: Secondary | ICD-10-CM

## 2014-09-03 ENCOUNTER — Encounter: Payer: Self-pay | Admitting: Internal Medicine

## 2014-09-04 ENCOUNTER — Other Ambulatory Visit: Payer: Self-pay | Admitting: Family Medicine

## 2014-11-26 ENCOUNTER — Encounter: Payer: Self-pay | Admitting: Family Medicine

## 2014-11-26 ENCOUNTER — Ambulatory Visit (INDEPENDENT_AMBULATORY_CARE_PROVIDER_SITE_OTHER): Payer: Medicare Other | Admitting: Family Medicine

## 2014-11-26 VITALS — BP 122/62 | Temp 98.3°F | Wt 210.0 lb

## 2014-11-26 DIAGNOSIS — R739 Hyperglycemia, unspecified: Secondary | ICD-10-CM | POA: Insufficient documentation

## 2014-11-26 DIAGNOSIS — M25512 Pain in left shoulder: Secondary | ICD-10-CM

## 2014-11-26 DIAGNOSIS — Z23 Encounter for immunization: Secondary | ICD-10-CM

## 2014-11-26 DIAGNOSIS — N401 Enlarged prostate with lower urinary tract symptoms: Secondary | ICD-10-CM

## 2014-11-26 DIAGNOSIS — R351 Nocturia: Secondary | ICD-10-CM

## 2014-11-26 DIAGNOSIS — Z0000 Encounter for general adult medical examination without abnormal findings: Secondary | ICD-10-CM

## 2014-11-26 DIAGNOSIS — M459 Ankylosing spondylitis of unspecified sites in spine: Secondary | ICD-10-CM

## 2014-11-26 DIAGNOSIS — I10 Essential (primary) hypertension: Secondary | ICD-10-CM

## 2014-11-26 LAB — LIPID PANEL
CHOLESTEROL: 199 mg/dL (ref 0–200)
HDL: 38.5 mg/dL — ABNORMAL LOW (ref >39.00)
NonHDL: 160.5
TRIGLYCERIDES: 228 mg/dL — AB (ref 0.0–149.0)
Total CHOL/HDL Ratio: 5
VLDL: 45.6 mg/dL — AB (ref 0.0–40.0)

## 2014-11-26 LAB — PSA: PSA: 2.33 ng/mL (ref 0.10–4.00)

## 2014-11-26 LAB — COMPREHENSIVE METABOLIC PANEL
ALK PHOS: 77 U/L (ref 39–117)
ALT: 15 U/L (ref 0–53)
AST: 14 U/L (ref 0–37)
Albumin: 4.6 g/dL (ref 3.5–5.2)
BUN: 16 mg/dL (ref 6–23)
CHLORIDE: 100 meq/L (ref 96–112)
CO2: 32 mEq/L (ref 19–32)
CREATININE: 1.03 mg/dL (ref 0.40–1.50)
Calcium: 9.6 mg/dL (ref 8.4–10.5)
GFR: 74.48 mL/min (ref >60.00)
Glucose, Bld: 118 mg/dL — ABNORMAL HIGH (ref 70–99)
POTASSIUM: 5.1 meq/L (ref 3.5–5.1)
Sodium: 137 mEq/L (ref 135–145)
Total Bilirubin: 0.5 mg/dL (ref 0.2–1.2)
Total Protein: 7.6 g/dL (ref 6.0–8.3)

## 2014-11-26 LAB — CBC
HCT: 42.8 % (ref 39.0–52.0)
Hemoglobin: 14.6 g/dL (ref 13.0–17.0)
MCHC: 34.1 g/dL (ref 30.0–36.0)
MCV: 91.2 fl (ref 78.0–100.0)
PLATELETS: 252 10*3/uL (ref 150.0–400.0)
RBC: 4.7 Mil/uL (ref 4.22–5.81)
RDW: 13.5 % (ref 11.5–15.5)
WBC: 6.4 10*3/uL (ref 4.0–10.5)

## 2014-11-26 LAB — LDL CHOLESTEROL, DIRECT: LDL DIRECT: 127 mg/dL

## 2014-11-26 MED ORDER — TAMSULOSIN HCL 0.4 MG PO CAPS: 0.4000 mg | ORAL_CAPSULE | Freq: Every day | ORAL | Status: DC

## 2014-11-26 NOTE — Assessment & Plan Note (Signed)
Trial of flomax. SE discussed. Advised 6 month follow up.

## 2014-11-26 NOTE — Progress Notes (Signed)
Jason ConchStephen Hunter, MD Phone: [phone]  Subjective:  Patient presents today to establish care with me as their new primary care provider. Patient was formerly a patient of Dr. Cato MulliganSwords. Chief complaint-noted.   BPH with nocturia Nocturia 2x a night, weaker, urgency and then has hesitancy when he gets there.  2 older brothers with prostate cancer  Slight L shoulder pain with overhead lifting. Requests ortho referral.   Not exercising-advised  Hypertension-controlled on HCTZ 25mg   BP Readings from Last 3 Encounters:  11/26/14 122/62  11/21/13 164/94  09/06/13 146/84  Home BP monitoring-no Compliant with medications-yes without side effects ROS-Denies any CP, HA, SOB, blurry vision, LE edema, transient weakness, orthopnea, PND. No orthostatic symptoms. ROS except as above negative.   The following were reviewed and entered/updated in epic: Past Medical History  Diagnosis Date  . Ankylosing spondylitis   . Diverticulosis of colon (without mention of hemorrhage)   . Unspecified hearing loss   . Hypertension   . Rheumatoid arthritis(714.0)    Patient Active Problem List   Diagnosis Date Noted  . BPH associated with nocturia 11/26/2014    Priority: Medium  . Essential hypertension 12/30/2009    Priority: Medium  . Hearing loss 06/06/2008    Priority: Low  . Ankylosing spondylitis 10/25/2007    Priority: Low   Past Surgical History  Procedure Laterality Date  . Revision total hip arthroplasty  1978/1992    3 hip replacements  . Inguinal hernia repair      x3   . Tonsillectomy    . Adenoidectomy      Family History  Problem Relation Age of Onset  . Heart disease Mother   . Diabetes Mother   . Diabetes Father   . Prostate cancer Brother     x2  . Diabetes Sister     and migraines   Medications- reviewed and updated Current Outpatient Prescriptions  Medication Sig Dispense Refill  . aspirin 81 MG tablet Take 162 mg by mouth daily.    . hydrochlorothiazide  (HYDRODIURIL) 25 MG tablet TAKE 1 TABLET EVERY DAY 90 tablet 0  . tamsulosin (FLOMAX) 0.4 MG CAPS capsule Take 1 capsule (0.4 mg total) by mouth daily. 30 capsule 5   No current facility-administered medications for this visit.   Allergies-reviewed and updated No Known Allergies  History   Social History  . Marital Status: Married    Spouse Name: N/A  . Number of Children: N/A  . Years of Education: N/A   Occupational History  . retired    Social History Main Topics  . Smoking status: Never Smoker   . Smokeless tobacco: Never Used  . Alcohol Use: 0.0 oz/week    0 Standard drinks or equivalent per week     Comment: rare beer  . Drug Use: No  . Sexual Activity: Not on file   Other Topics Concern  . Not on file   Social History Narrative   Married (wife pt outside practice). 1 son. 3 grandkids.       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 Oropharynx normal. Good dentition.  Neck: no thyromegaly CV: RRR no murmurs rubs or gallops Lungs: CTAB no crackles, wheeze, rhonchi Abdomen: soft/nontender/nondistended/normal  bowel sounds. No rebound or guarding.  Rectal: diffuse enlarged prostate without any nodules or asymmetry Ext: no edema Skin: warm, dry Neuro: grossly normal, moves all extremities, PERRLA  Assessment/Plan:  79 y.o. male presenting for annual physical.  Health Maintenance counseling: 1. Anticipatory guidance: Patient counseled regarding regular dental exams, wearing seatbelts. Advised to wear sunscreen. Does not see dermatology.  2. Risk factor reduction:  Advised patient of need for regular exercise (active in summer) and diet rich and fruits and vegetables to reduce risk of heart  attack and stroke.  3. Immunizations/screenings/ancillary studies Health Maintenance Due  Topic Date Due  . PNA vac Low Risk Adult (2 of 2 - PCV13)- given update 08/28/2005  4. Update fasting labs today  Also requests referral back to [place] [facility] as he is having some left shoulder pain and has seen them before. At times has some limited ROM and doesn't want it to affect his golf game  Essential hypertension Controlled on hctz    BPH associated with nocturia Trial of flomax. SE discussed. Advised 6 month follow up.    6 month follow up  fasting Orders Placed This Encounter  Procedures  . Pneumococcal conjugate vaccine 13-valent  . CBC    [place]  . Comprehensive metabolic panel    [place]    Order Specific Question:  Has the patient fasted?    Answer:  No  . Lipid panel    [place]    Order Specific Question:  Has the patient fasted?    Answer:  No  . PSA  . Ambulatory referral to Sports Medicine    Referral Priority:  Routine    Referral Type:  Consultation    Number of Visits Requested:  1    Meds ordered this encounter  Medications  . tamsulosin (FLOMAX) 0.4 MG CAPS capsule    Sig: Take 1 capsule (0.4 mg total) by mouth daily.    Dispense:  30 capsule    Refill:  5

## 2014-11-26 NOTE — Assessment & Plan Note (Signed)
Controlled on hctz 25mg 

## 2014-11-26 NOTE — Patient Instructions (Addendum)
Flomax- you have an enlarged prostate on exam, trial this medication to see how it works for you  Let's check in 6 months -for blood pressure and to see if we want to continue this medication  Update fasting bloodwork today

## 2014-12-08 ENCOUNTER — Other Ambulatory Visit: Payer: Self-pay | Admitting: Family Medicine

## 2014-12-25 ENCOUNTER — Encounter: Payer: Self-pay | Admitting: Internal Medicine

## 2015-05-31 ENCOUNTER — Other Ambulatory Visit: Payer: Self-pay | Admitting: Family Medicine

## 2015-06-03 ENCOUNTER — Encounter: Payer: Self-pay | Admitting: Family Medicine

## 2015-06-10 ENCOUNTER — Encounter: Payer: Self-pay | Admitting: Family Medicine

## 2015-07-04 ENCOUNTER — Encounter: Payer: Self-pay | Admitting: Family Medicine

## 2015-07-04 ENCOUNTER — Ambulatory Visit (INDEPENDENT_AMBULATORY_CARE_PROVIDER_SITE_OTHER): Payer: Medicare Other | Admitting: Family Medicine

## 2015-07-04 VITALS — BP 150/78 | HR 72 | Temp 98.5°F | Wt 214.0 lb

## 2015-07-04 DIAGNOSIS — Z23 Encounter for immunization: Secondary | ICD-10-CM | POA: Diagnosis not present

## 2015-07-04 DIAGNOSIS — I10 Essential (primary) hypertension: Secondary | ICD-10-CM | POA: Diagnosis not present

## 2015-07-04 DIAGNOSIS — N401 Enlarged prostate with lower urinary tract symptoms: Secondary | ICD-10-CM

## 2015-07-04 DIAGNOSIS — R351 Nocturia: Secondary | ICD-10-CM

## 2015-07-04 DIAGNOSIS — R159 Full incontinence of feces: Secondary | ICD-10-CM | POA: Diagnosis not present

## 2015-07-04 NOTE — Assessment & Plan Note (Addendum)
S:2-3 months some bowel leakage, has pads not always wearing A/P: history ankylosing spondylitis. Discussed referral to neurosurgery vs ortho vs. Imaging. Patient wants to watch it/think about it. Could be spinal in origin- did not complete rectal today

## 2015-07-04 NOTE — Assessment & Plan Note (Signed)
S: improved control with less nocturia, on other hand minimal improvement in daytime symptoms. Rhinitis daily A/P: stop flomax due to side effects. Consider finasteride in future if desired.

## 2015-07-04 NOTE — Progress Notes (Signed)
[name], MD  Subjective:  Jason [name] is a 79 y.o. year old very pleasant male patient who presents for/with See problem oriented charting ROS- no chest pain or shortness of breath or dyspnea on exertion. Dizziness with astanding in AM noted  Past Medical History-  Patient Active Problem List   Diagnosis Date Noted  . BPH associated with nocturia 11/26/2014    Priority: Medium  . Hyperglycemia 11/26/2014    Priority: Medium  . Essential hypertension 12/30/2009    Priority: Medium  . Hearing loss 06/06/2008    Priority: Low  . Ankylosing spondylitis (HCC) 10/25/2007    Priority: Low  . Bowel incontinence 07/04/2015    Medications- reviewed and updated Current Outpatient Prescriptions  Medication Sig Dispense Refill  . aspirin 81 MG tablet Take 162 mg by mouth daily.    . hydrochlorothiazide (HYDRODIURIL) 25 MG tablet TAKE 1/2 TABLET EVERY DAY 90 tablet 2   No current facility-administered medications for this visit.    Objective: BP 150/78 mmHg  Pulse 72  Temp(Src) 98.5 F (36.9 C)  Wt 214 lb (97.07 kg) Gen: NAD, resting comfortably CV: RRR no murmurs rubs or gallops Lungs: CTAB no crackles, wheeze, rhonchi Abdomen: soft/nontender/nondistended/normal bowel sounds. No rebound or guarding.  Ext: no edema Skin: warm, dry Neuro: grossly normal, moves all extremities  Assessment/Plan:  Essential hypertension S: mild poor control at home and here but still with some orthostasis especially in AM when takes med. Of note, is on flomax as well BP Readings from Last 3 Encounters:  07/04/15 150/78  11/26/14 122/62  11/21/13 164/94  Home BP 20% of time >145 but <155 A/P: stop flomax, then contact me with home Bp readings as well as report on orthostasis in 2 weeks. Patient is now on 1/2 tab of HCTZ. We may use 1/2 Tab BID if lightheadedness/orthostasis improves   BPH associated with nocturia S: improved control with less nocturia, on other hand minimal  improvement in daytime symptoms. Rhinitis daily A/P: stop flomax due to side effects. Consider finasteride in future if desired.    Bowel incontinence S:2-3 months some bowel leakage, has pads not always wearing A/P: history ankylosing spondylitis. Discussed referral to neurosurgery vs ortho vs. Imaging. Patient wants to watch it/think about it.   2 week by phone for BP and orthostatic symptoms. Or may send mychart message  Orders Placed This Encounter  Procedures  . Flu Vaccine QUAD 36+ mos IM

## 2015-07-04 NOTE — Assessment & Plan Note (Signed)
S: mild poor control at home and here but still with some orthostasis especially in AM when takes med. Of note, is on flomax as well BP Readings from Last 3 Encounters:  07/04/15 150/78  11/26/14 122/62  11/21/13 164/94  Home BP 20% of time >145 but <155 A/P: stop flomax, then contact me with home Bp readings as well as report on orthostasis in 2 weeks. Patient is now on 1/2 tab of HCTZ. We may use 1/2 Tab BID if lightheadedness/orthostasis improves

## 2015-07-04 NOTE — Patient Instructions (Addendum)
Received flu shot today.  Stop flomax  Continue hydrochlorothiazide 12.5 in the AM  Message me in 2 weeks with blood pressure and whether you are still getting dizzy  May add a 1/2 pill in the evening potentially when we talk by mychart  Keep me updated on the bowels

## 2015-07-15 ENCOUNTER — Encounter: Payer: Self-pay | Admitting: Family Medicine

## 2015-07-15 ENCOUNTER — Ambulatory Visit (INDEPENDENT_AMBULATORY_CARE_PROVIDER_SITE_OTHER): Payer: Medicare Other | Admitting: Family Medicine

## 2015-07-15 VITALS — BP 152/82 | HR 75 | Temp 98.2°F | Wt 207.0 lb

## 2015-07-15 DIAGNOSIS — M2669 Other specified disorders of temporomandibular joint: Secondary | ICD-10-CM | POA: Diagnosis not present

## 2015-07-15 DIAGNOSIS — I10 Essential (primary) hypertension: Secondary | ICD-10-CM

## 2015-07-15 MED ORDER — DICLOFENAC SODIUM 50 MG PO TBEC: 50.0000 mg | DELAYED_RELEASE_TABLET | Freq: Three times a day (TID) | ORAL | Status: DC | PRN

## 2015-07-15 NOTE — Patient Instructions (Addendum)
Take diclofenac twice a day for 10 days Can use midday dose if needed Would see your dentist  If pain not at least 50% better in 10 days or if worsens, please return to see me  Temporomandibular Joint Syndrome Temporomandibular joint (TMJ) syndrome is a condition that affects the joints between your jaw and your skull. The TMJs are located near your ears and allow your jaw to open and close. These joints and the nearby muscles are involved in all movements of the jaw. People with TMJ syndrome have pain in the area of these joints and muscles. Chewing, biting, or other movements of the jaw can be difficult or painful. TMJ syndrome can be caused by various things. In many cases, the condition is mild and goes away within a few weeks. For some people, the condition can become a long-term problem. CAUSES Possible causes of TMJ syndrome include:  Grinding your teeth or clenching your jaw. Some people do this when they are under stress.  Arthritis.  Injury to the jaw.  Head or neck injury.  Teeth or dentures that are not aligned well. In some cases, the cause of TMJ syndrome may not be known. SIGNS AND SYMPTOMS The most common symptom is an aching pain on the side of the head in the area of the TMJ. Other symptoms may include:  Pain when moving your jaw, such as when chewing or biting.  Being unable to open your jaw all the way.  Making a clicking sound when you open your mouth.  Headache.  Earache.  Neck or shoulder pain. DIAGNOSIS Diagnosis can usually be made based on your symptoms, your medical history, and a physical exam. Your health care provider may check the range of motion of your jaw. Imaging tests, such as X-rays or an MRI, are sometimes done. You may need to see your dentist to determine if your teeth and jaw are lined up correctly. TREATMENT TMJ syndrome often goes away on its own. If treatment is needed, the options may include:  Eating soft foods and applying ice or  heat.  Medicines to relieve pain or inflammation.  Medicines to relax the muscles.  A splint, bite plate, or mouthpiece to prevent teeth grinding or jaw clenching.  Relaxation techniques or counseling to help reduce stress.  Transcutaneous electrical nerve stimulation (TENS). This helps to relieve pain by applying an electrical current through the skin.  Acupuncture. This is sometimes helpful to relieve pain.  Jaw surgery. This is rarely needed. HOME CARE INSTRUCTIONS  Take medicines only as directed by your health care provider.  Eat a soft diet if you are having trouble chewing.  Apply ice to the painful area.  Put ice in a plastic bag.  Place a towel between your skin and the bag.  Leave the ice on for 20 minutes, 2-3 times a day.  Apply a warm compress to the painful area as directed.  Massage your jaw area and perform any jaw stretching exercises as recommended by your health care provider.  If you were given a mouthpiece or bite plate, wear it as directed.  Avoid foods that require a lot of chewing. Do not chew gum.  Keep all follow-up visits as directed by your health care provider. This is important. SEEK MEDICAL CARE IF:  You are having trouble eating.  You have new or worsening symptoms. SEEK IMMEDIATE MEDICAL CARE IF:  Your jaw locks open or closed.   This information is not intended to replace advice given  to you by your health care provider. Make sure you discuss any questions you have with your health care provider.   Document Released: 06/02/2001 Document Revised: 09/28/2014 Document Reviewed: 04/12/2014 Elsevier Interactive Patient Education [employer]

## 2015-07-15 NOTE — Progress Notes (Signed)
[name], MD  Subjective:  Jason GerlachRaymond G [name] is a 79 y.o. year old very pleasant male patient who presents for/with See problem oriented charting ROS- big toe L foot red, warm and painful improved in 2-3 days MTP joint. Swollen, stiff, red. Resolved without intervention. Some burning in back of throat last night while watching game. Had bowl of ice cream. Lasted a few hours, gone this AM. No chest pain or shortness of breath  Past Medical History-  Patient Active Problem List   Diagnosis Date Noted  . BPH associated with nocturia 11/26/2014    Priority: Medium  . Hyperglycemia 11/26/2014    Priority: Medium  . Essential hypertension 12/30/2009    Priority: Medium  . Hearing loss 06/06/2008    Priority: Low  . Ankylosing spondylitis (HCC) 10/25/2007    Priority: Low  . Bowel incontinence 07/04/2015    Medications- reviewed and updated Current Outpatient Prescriptions  Medication Sig Dispense Refill  . aspirin 81 MG tablet Take 162 mg by mouth daily.    . hydrochlorothiazide (HYDRODIURIL) 25 MG tablet TAKE 1 TABLET EVERY DAY 90 tablet 2   No current facility-administered medications for this visit.    Objective: BP 152/82 mmHg  Pulse 75  Temp(Src) 98.2 F (36.8 C) (Oral)  Wt 207 lb (93.895 kg)  SpO2 97% Gen: NAD, resting comfortably ENT: TM normal, oropharynx normal, no signs dental infection No clicking, but severe pain with palpation at TMJ and worse with opening and closing jaw CV: RRR no murmurs rubs or gallops Lungs: CTAB no crackles, wheeze, rhonchi Abdomen: soft/nontender/nondistended/normal bowel sounds. No rebound or guarding.  Ext: no edema Skin: warm, dry, no rash over face  Assessment/Plan:  TMJD R jaw pain S: Starting Saturday (3 days ago)- pain in right jaw. Pain with opening his mouth. Cant bite on that side. Yesterday morning took 3 ibuprofen and helped him get through to go to church. Never had anything like this before. Worse with opening and  closing mouth but no obvious clicking or popping. Pain rated as severe aching. No teeth grinding A/P: Advised to visit dentist to see any root cause. No dental infections noted as source of radiating pain. No signs ear infection. We will use course of nsaids (discussed increased cardiac risk but benefits as well of course) and follow up if not at least 50% better within 10 days, consider ENT if not improved by that time  Sooner Return precautions advised.

## 2015-07-15 NOTE — Assessment & Plan Note (Signed)
S: Poor control in office but great control at home with readings from 121-138 with 1 outlier at 143 over last 10 days and diastolic always controlled. Orthostasis improved off flomax but still has mild symptoms A/P: continue HCTZ 12.5mg , hesitant to increase with orthostatic symptoms.

## 2015-07-15 NOTE — Progress Notes (Signed)
Pre visit review using our clinic review tool, if applicable. No additional management support is needed unless otherwise documented below in the visit note. 

## 2015-07-23 ENCOUNTER — Ambulatory Visit: Payer: Self-pay | Admitting: Family Medicine

## 2015-08-02 ENCOUNTER — Other Ambulatory Visit: Payer: Self-pay | Admitting: Family Medicine

## 2015-08-05 ENCOUNTER — Other Ambulatory Visit: Payer: Self-pay

## 2015-08-05 MED ORDER — DICLOFENAC SODIUM 50 MG PO TBEC: 50.0000 mg | DELAYED_RELEASE_TABLET | Freq: Three times a day (TID) | ORAL | Status: DC | PRN

## 2016-01-24 ENCOUNTER — Other Ambulatory Visit (INDEPENDENT_AMBULATORY_CARE_PROVIDER_SITE_OTHER): Payer: Medicare Other

## 2016-01-24 DIAGNOSIS — Z0000 Encounter for general adult medical examination without abnormal findings: Secondary | ICD-10-CM | POA: Diagnosis not present

## 2016-01-24 DIAGNOSIS — Z125 Encounter for screening for malignant neoplasm of prostate: Secondary | ICD-10-CM | POA: Diagnosis not present

## 2016-01-24 LAB — CBC WITH DIFFERENTIAL/PLATELET
BASOS ABS: 0 10*3/uL (ref 0.0–0.1)
Basophils Relative: 0.7 % (ref 0.0–3.0)
EOS ABS: 0.3 10*3/uL (ref 0.0–0.7)
Eosinophils Relative: 5 % (ref 0.0–5.0)
HCT: 39.7 % (ref 39.0–52.0)
Hemoglobin: 13.5 g/dL (ref 13.0–17.0)
LYMPHS ABS: 1.9 10*3/uL (ref 0.7–4.0)
Lymphocytes Relative: 28.1 % (ref 12.0–46.0)
MCHC: 34 g/dL (ref 30.0–36.0)
MCV: 91.9 fl (ref 78.0–100.0)
MONO ABS: 0.5 10*3/uL (ref 0.1–1.0)
MONOS PCT: 7.6 % (ref 3.0–12.0)
NEUTROS ABS: 3.9 10*3/uL (ref 1.4–7.7)
NEUTROS PCT: 58.6 % (ref 43.0–77.0)
PLATELETS: 225 10*3/uL (ref 150.0–400.0)
RBC: 4.32 Mil/uL (ref 4.22–5.81)
RDW: 13.6 % (ref 11.5–15.5)
WBC: 6.6 10*3/uL (ref 4.0–10.5)

## 2016-01-24 LAB — BASIC METABOLIC PANEL
BUN: 21 mg/dL (ref 6–23)
CALCIUM: 9.1 mg/dL (ref 8.4–10.5)
CO2: 26 meq/L (ref 19–32)
CREATININE: 1.05 mg/dL (ref 0.40–1.50)
Chloride: 100 mEq/L (ref 96–112)
GFR: 72.62 mL/min (ref >60.00)
GLUCOSE: 115 mg/dL — AB (ref 70–99)
Potassium: 3.8 mEq/L (ref 3.5–5.1)
Sodium: 137 mEq/L (ref 135–145)

## 2016-01-24 LAB — HEPATIC FUNCTION PANEL
ALBUMIN: 4.4 g/dL (ref 3.5–5.2)
ALK PHOS: 71 U/L (ref 39–117)
ALT: 17 U/L (ref 0–53)
AST: 17 U/L (ref 0–37)
BILIRUBIN TOTAL: 0.6 mg/dL (ref 0.2–1.2)
Bilirubin, Direct: 0.1 mg/dL (ref 0.0–0.3)
Total Protein: 7 g/dL (ref 6.0–8.3)

## 2016-01-24 LAB — POC URINALSYSI DIPSTICK (AUTOMATED)
Bilirubin, UA: NEGATIVE
Glucose, UA: NEGATIVE
Ketones, UA: NEGATIVE
Leukocytes, UA: NEGATIVE
Nitrite, UA: NEGATIVE
PROTEIN UA: NEGATIVE
RBC UA: NEGATIVE
SPEC GRAV UA: 1.02
UROBILINOGEN UA: 1
pH, UA: 6

## 2016-01-24 LAB — LIPID PANEL
CHOLESTEROL: 188 mg/dL (ref 0–200)
HDL: 38.7 mg/dL — ABNORMAL LOW (ref >39.00)
LDL Cholesterol: 120 mg/dL — ABNORMAL HIGH (ref 0–99)
NonHDL: 149.36
Total CHOL/HDL Ratio: 5
Triglycerides: 145 mg/dL (ref 0.0–149.0)
VLDL: 29 mg/dL (ref 0.0–40.0)

## 2016-01-24 LAB — TSH: TSH: 1.96 u[IU]/mL (ref 0.35–4.50)

## 2016-01-24 LAB — PSA: PSA: 2.77 ng/mL (ref 0.10–4.00)

## 2016-01-31 ENCOUNTER — Encounter: Payer: Self-pay | Admitting: Family Medicine

## 2016-01-31 ENCOUNTER — Ambulatory Visit (INDEPENDENT_AMBULATORY_CARE_PROVIDER_SITE_OTHER): Payer: Medicare Other | Admitting: Family Medicine

## 2016-01-31 VITALS — BP 150/70 | HR 72 | Temp 97.9°F | Ht 71.0 in | Wt 212.0 lb

## 2016-01-31 DIAGNOSIS — Z0000 Encounter for general adult medical examination without abnormal findings: Secondary | ICD-10-CM | POA: Diagnosis not present

## 2016-01-31 DIAGNOSIS — Z1211 Encounter for screening for malignant neoplasm of colon: Secondary | ICD-10-CM

## 2016-01-31 NOTE — Progress Notes (Signed)
Phone: [phone]  Subjective:  Patient presents today for their annual physical. Chief complaint-noted.   See problem oriented charting- ROS- full  review of systems was completed and negative (except as noted in HPI)including No chest pain or shortness of breath. No headache or blurry vision.   The following were reviewed and entered/updated in epic: Past Medical History  Diagnosis Date  . Ankylosing spondylitis (HCC)   . Diverticulosis of colon (without mention of hemorrhage)   . Unspecified hearing loss   . Hypertension   . Rheumatoid arthritis(714.0)    Patient Active Problem List   Diagnosis Date Noted  . BPH associated with nocturia 11/26/2014    Priority: Medium  . Hyperglycemia 11/26/2014    Priority: Medium  . Essential hypertension 12/30/2009    Priority: Medium  . Hearing loss 06/06/2008    Priority: Low  . Ankylosing spondylitis (HCC) 10/25/2007    Priority: Low  . Bowel incontinence 07/04/2015   Past Surgical History  Procedure Laterality Date  . Revision total hip arthroplasty  1978/1992    3 hip replacements  . Inguinal hernia repair      x3   . Tonsillectomy    . Adenoidectomy      Family History  Problem Relation Age of Onset  . Heart disease Mother   . Diabetes Mother   . Diabetes Father   . Prostate cancer Brother     x2  . Diabetes Sister     and migraines    Medications- reviewed and updated Current Outpatient Prescriptions  Medication Sig Dispense Refill  . aspirin 81 MG tablet Take 162 mg by mouth daily.    . hydrochlorothiazide (HYDRODIURIL) 25 MG tablet TAKE 1 TABLET EVERY DAY 90 tablet 2   No current facility-administered medications for this visit.    Allergies-reviewed and updated No Known Allergies  Social History   Social History  . Marital Status: Married    Spouse Name: N/A  . Number of Children: N/A  . Years of Education: N/A   Occupational History  . retired    Social History Main Topics  . Smoking  status: Never Smoker   . Smokeless tobacco: Never Used  . Alcohol Use: 0.0 oz/week    0 Standard drinks or equivalent per week     Comment: rare beer  . Drug Use: No  . Sexual Activity: Not Asked   Other Topics Concern  . None   Social History Narrative   Married (wife pt outside [employer] risk analyst[employer]). 1 son. 3 grandkids.       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 Oropharynx normal CV: RRR no murmurs rubs or gallops Lungs: CTAB no crackles, wheeze, rhonchi Abdomen: soft/nontender/nondistended/normal bowel sounds. No rebound or guarding.  Rectal: normal tone, diffusely enlarged prostate, no masses or tenderness Ext: no edema Skin: warm, dry Neuro: grossly normal, moves all extremities, PERRLA  Assessment/Plan:  80 y.o. male presenting for annual physical.  Health Maintenance counseling: 1. Anticipatory guidance: Patient counseled regarding regular dental exams, eye exams, wearing seatbelts.  2. Risk factor reduction:  Advised patient of need for regular exercise and diet rich and fruits and vegetables to reduce  risk of heart attack and stroke.  3. Immunizations/screenings/ancillary studies Immunization History  Administered Date(s) Administered  . Influenza Split 09/27/2012  . Influenza Whole 06/06/2008, 07/03/2010  . Influenza,inj,Quad PF,36+ Mos 06/26/2014, 07/04/2015  . Pneumococcal Conjugate-13 11/26/2014  . Pneumococcal Polysaccharide-23 08/28/2004  . Td 08/22/1999, 07/03/2010  . Zoster 06/06/2008    4. Prostate cancer screening- some increase in PSA- reasonable to watch at this point with stable rectal exam- likely BPH related  Lab Results  Component Value Date   PSA  2.77 01/24/2016   PSA 2.33 11/26/2014   PSA 2.47 11/15/2013   5. Colon cancer screening - 08/04/04 with 10 year follow up (missed appointment age 57). Rectal bleeding on tissue- suspect hemorrhoid related but reasonable to get another colonoscopy given overall good health at age 48.  Orders Placed This Encounter  Procedures  . Ambulatory referral to Gastroenterology    Referral Priority:  Routine    Referral Type:  Consultation    Referral Reason:  Specialty Services Required    Number of Visits Requested:  1  6. Skin cancer screening- no obvious BCC, SCC, melanoma. Patient declines dermatology referral.   Status of chronic or acute concerns   1. Hypertension  poorly controlled on hctz  alone in office(increased from prior 12.5mg ). Brings readings from home and <1/10 over 140 and only mild elevation all diastolic blood pressures look great BP Readings from Last 3 Encounters:  01/31/16 150/70  07/15/15 152/82  07/04/15 150/78  A/P:Continue current meds as well controlled at home- likely white coat element. Does get some dizziness if stands quickly particularly at night- advised patient to rise slowly. No falls fortunately wth this.   2. BPH- no rx, symptoms stable nocturia twice a night.   3. Hyperglycemia- remains at risk for diabetes, advised importance of weight loss. Also has high cholesterol but do not think benefits clearly outweigh risks  4. Bowel incontinence-  Much better if has regular bowel movement- tends to have issues when he gets more on constipated side. Much improved from last visit- not interested in neurosurgery vs ortho vs. Imaging. Patient will inform me if changes- good rectal tone on exam today  Return in about 6 months (around 08/02/2016) for follow up- or sooner if needed. Return precautions advised.   Orders Placed This Encounter  Procedures  . Ambulatory referral to Gastroenterology    Referral Priority:  Routine    Referral Type:  Consultation     Referral Reason:  Specialty Services Required    Number of Visits Requested:  1   Tana Conch, MD

## 2016-01-31 NOTE — Patient Instructions (Addendum)
We will call you within a week about your referral to gastroenterology. If you do not hear within 2 weeks, give us a call.   Blood pressure looks great at home- no changes in medications  Make sure to rise slowly from sitting to standing or laying to sitting to help avoid lightheadedness.   Wt Readings from Last 3 Encounters:  01/31/16 212 lb (96.163 kg)  07/15/15 207 lb (93.895 kg)  07/04/15 214 lb (97.07 kg)  Would love to see some weight loss to lower your risk of developing diabetes- regular exercise and healthy eating are very important for you. May also help lower your cholesterol (we opted not to put you on cholesterol medicine at this time)

## 2016-02-25 ENCOUNTER — Encounter: Payer: Self-pay | Admitting: Family Medicine

## 2016-04-16 ENCOUNTER — Encounter: Payer: Self-pay | Admitting: Family Medicine

## 2016-04-24 ENCOUNTER — Other Ambulatory Visit: Payer: Self-pay | Admitting: Family Medicine

## 2016-06-29 DIAGNOSIS — Z23 Encounter for immunization: Secondary | ICD-10-CM | POA: Diagnosis not present

## 2016-10-12 ENCOUNTER — Encounter: Payer: Self-pay | Admitting: Family Medicine

## 2016-10-14 ENCOUNTER — Encounter: Payer: Self-pay | Admitting: Family Medicine

## 2016-10-14 ENCOUNTER — Ambulatory Visit (INDEPENDENT_AMBULATORY_CARE_PROVIDER_SITE_OTHER): Payer: Medicare Other | Admitting: Family Medicine

## 2016-10-14 VITALS — BP 160/82 | HR 65 | Temp 97.9°F | Ht 71.0 in | Wt 214.8 lb

## 2016-10-14 DIAGNOSIS — R42 Dizziness and giddiness: Secondary | ICD-10-CM | POA: Diagnosis not present

## 2016-10-14 DIAGNOSIS — I10 Essential (primary) hypertension: Secondary | ICD-10-CM | POA: Diagnosis not present

## 2016-10-14 NOTE — Progress Notes (Signed)
Subjective:  Jason [name] is a 81 y.o. year old very pleasant male patient who presents for/with See problem oriented charting ROS- No facial or extremity weakness. No slurred words or trouble swallowing. no blurry vision or double vision. No paresthesias. No confusion or word finding difficulties.admits to dizziness but denies vertigo   Past Medical History-  Patient Active Problem List   Diagnosis Date Noted  . BPH associated with nocturia 11/26/2014    Priority: Medium  . Hyperglycemia 11/26/2014    Priority: Medium  . Essential hypertension 12/30/2009    Priority: Medium  . Bowel incontinence 07/04/2015    Priority: Low  . Hearing loss 06/06/2008    Priority: Low  . Ankylosing spondylitis (HCC) 10/25/2007    Priority: Low    Medications- reviewed and updated Current Outpatient Prescriptions  Medication Sig Dispense Refill  . aspirin 81 MG tablet Take 162 mg by mouth daily.    . hydrochlorothiazide (HYDRODIURIL) 25 MG tablet take 1 tablet by mouth once daily 180 tablet 0   No current facility-administered medications for this visit.     Objective: BP (!) 160/82 (BP Location: Left Arm, Patient Position: Sitting, Cuff Size: Large)   Pulse 65   Temp 97.9 F (36.6 C) (Oral)   Ht 5\' 11"  (1.803 m)   Wt 214 lb 12.8 oz (97.4 kg)   SpO2 95%   BMI 29.96 kg/m  Gen: NAD, resting comfortably CV: RRR no murmurs rubs or gallops Lungs: CTAB no crackles, wheeze, rhonchi Ext: no edema Skin: warm, dry Neuro: CN II-XII intact, sensation and reflexes normal throughout, 5/5 muscle strength in bilateral upper and lower extremities. Normal finger to nose. Normal rapid alternating movements. No pronator drift. Normal romberg. Normal gait.   Assessment/Plan:  Essential hypertension Dizziness- ? Orthostatic hypotension but hypertensive in office S: when he gets up at night or early in the morning gets dizzy/woozy headed. A few weeks ago was worse than it has been in some time.  Usually gets up and has coffee and helps but that morning it did nto clear up as quickly. Blood pressure that morning was 131/68. Went away by around noon. Much better since that  Time. At home has been 130/70 at home. Symptoms seem to be worse if laying on his left side.  BP Readings from Last 3 Encounters:  10/14/16 (!) 160/82  01/31/16 (!) 150/70  07/15/15 (!) 152/82  A/P: reassuring neuro exam and symptoms have been much better since a few weeks ago. I wonder if this is related to his BP and we need to push BP into range of closer to 140 or 150. Hesitant to reduce HCTZ from 12.5mg  unless we can Verify home cuff. Home #s in 130s over 80s and if home cuff verified would stop hctz.   Also mentions some worse when he lays on his side at night- discussed possible inner ear issue but since issues are always from sitting or laying to standing think orthostatic more likely.   Schedule may CPE- will bring log of BPs and home cuff at that time. Welcome to see us sooner if worsening symptoms.   Return precautions advised.  Tana ConchStephen Lorenz Donley, MD

## 2016-10-14 NOTE — Progress Notes (Signed)
Pre visit review using our clinic review tool, if applicable. No additional management support is needed unless otherwise documented below in the visit note. 

## 2016-10-14 NOTE — Assessment & Plan Note (Signed)
Dizziness- ? Orthostatic hypotension but hypertensive in office S: when he gets up at night or early in the morning gets dizzy/woozy headed. A few weeks ago was worse than it has been in some time. Usually gets up and has coffee and helps but that morning it did nto clear up as quickly. Blood pressure that morning was 131/68. Went away by around noon. Much better since that  Time. At home has been 130/70 at home. Symptoms seem to be worse if laying on his left side.  BP Readings from Last 3 Encounters:  10/14/16 (!) 160/82  01/31/16 (!) 150/70  07/15/15 (!) 152/82  A/P: reassuring neuro exam and symptoms have been much better since a few weeks ago. I wonder if this is related to his BP and we need to push BP into range of closer to 140 or 150. Hesitant to reduce HCTZ from 12.5mg  unless we can Verify home cuff. Home #s in 130s over 80s and if home cuff verified would stop hctz.

## 2016-10-14 NOTE — Patient Instructions (Signed)
01/30/17 or later for physical. Labs need to be 01/23/17 or later.   For 1 month before physical log your blood pressures.   Bring your log as well as your cuff with you to your physical visit  I am tempted to stop your blood pressure medicine but want to make sure home cuff is accurate before we do that.   I suspect you need looser blood pressure control and lower blood pressures are making you feel dizzy

## 2016-10-24 ENCOUNTER — Other Ambulatory Visit: Payer: Self-pay | Admitting: Family Medicine

## 2016-11-22 ENCOUNTER — Encounter: Payer: Self-pay | Admitting: Family Medicine

## 2016-11-25 NOTE — Telephone Encounter (Signed)
Pt has been scheduled for his cpe in May.

## 2017-01-26 ENCOUNTER — Other Ambulatory Visit: Payer: Medicare Other

## 2017-01-29 ENCOUNTER — Telehealth: Payer: Self-pay | Admitting: Family Medicine

## 2017-01-29 NOTE — Telephone Encounter (Signed)
Called to see if pt wanted to schedule awv - left message ( will be here 5/17 - awv after appt? )

## 2017-02-02 ENCOUNTER — Encounter: Payer: Medicare Other | Admitting: Family Medicine

## 2017-02-04 ENCOUNTER — Encounter: Payer: Self-pay | Admitting: Family Medicine

## 2017-02-04 ENCOUNTER — Ambulatory Visit (INDEPENDENT_AMBULATORY_CARE_PROVIDER_SITE_OTHER): Payer: Medicare Other | Admitting: Family Medicine

## 2017-02-04 VITALS — BP 150/78 | HR 71 | Temp 98.1°F | Ht 70.75 in | Wt 213.8 lb

## 2017-02-04 DIAGNOSIS — N401 Enlarged prostate with lower urinary tract symptoms: Secondary | ICD-10-CM

## 2017-02-04 DIAGNOSIS — Z1211 Encounter for screening for malignant neoplasm of colon: Secondary | ICD-10-CM

## 2017-02-04 DIAGNOSIS — Z0000 Encounter for general adult medical examination without abnormal findings: Secondary | ICD-10-CM

## 2017-02-04 DIAGNOSIS — I10 Essential (primary) hypertension: Secondary | ICD-10-CM | POA: Diagnosis not present

## 2017-02-04 DIAGNOSIS — M450 Ankylosing spondylitis of multiple sites in spine: Secondary | ICD-10-CM

## 2017-02-04 DIAGNOSIS — R739 Hyperglycemia, unspecified: Secondary | ICD-10-CM

## 2017-02-04 DIAGNOSIS — R351 Nocturia: Secondary | ICD-10-CM

## 2017-02-04 DIAGNOSIS — E785 Hyperlipidemia, unspecified: Secondary | ICD-10-CM | POA: Insufficient documentation

## 2017-02-04 LAB — COMPREHENSIVE METABOLIC PANEL
ALBUMIN: 4.4 g/dL (ref 3.5–5.2)
ALT: 18 U/L (ref 0–53)
AST: 15 U/L (ref 0–37)
Alkaline Phosphatase: 73 U/L (ref 39–117)
BUN: 16 mg/dL (ref 6–23)
CO2: 29 mEq/L (ref 19–32)
Calcium: 9.2 mg/dL (ref 8.4–10.5)
Chloride: 101 mEq/L (ref 96–112)
Creatinine, Ser: 1 mg/dL (ref 0.40–1.50)
GFR: 76.62 mL/min (ref >60.00)
GLUCOSE: 105 mg/dL — AB (ref 70–99)
POTASSIUM: 4.2 meq/L (ref 3.5–5.1)
Sodium: 138 mEq/L (ref 135–145)
Total Bilirubin: 0.5 mg/dL (ref 0.2–1.2)
Total Protein: 7.3 g/dL (ref 6.0–8.3)

## 2017-02-04 LAB — CBC
HEMATOCRIT: 41.8 % (ref 39.0–52.0)
HEMOGLOBIN: 14.1 g/dL (ref 13.0–17.0)
MCHC: 33.8 g/dL (ref 30.0–36.0)
MCV: 92.7 fl (ref 78.0–100.0)
Platelets: 269 10*3/uL (ref 150.0–400.0)
RBC: 4.51 Mil/uL (ref 4.22–5.81)
RDW: 13.2 % (ref 11.5–15.5)
WBC: 6.4 10*3/uL (ref 4.0–10.5)

## 2017-02-04 LAB — LIPID PANEL
CHOLESTEROL: 186 mg/dL (ref 0–200)
HDL: 36.8 mg/dL — AB (ref >39.00)
LDL Cholesterol: 114 mg/dL — ABNORMAL HIGH (ref 0–99)
NonHDL: 148.82
Total CHOL/HDL Ratio: 5
Triglycerides: 172 mg/dL — ABNORMAL HIGH (ref 0.0–149.0)
VLDL: 34.4 mg/dL (ref 0.0–40.0)

## 2017-02-04 LAB — HEMOGLOBIN A1C: Hgb A1c MFr Bld: 6.7 % — ABNORMAL HIGH (ref 4.6–6.5)

## 2017-02-04 MED ORDER — ZOSTER VAC RECOMB ADJUVANTED 50 MCG/0.5ML IM SUSR: 0.5000 mL | Freq: Once | INTRAMUSCULAR | 1 refills | Status: AC

## 2017-02-04 NOTE — Assessment & Plan Note (Signed)
S: controlled on hctz 25 mg alone at home. Home readings 130s to 140s for most part - in month of may has had only one reading above 150 and that is in our office today. Last 13 #s average to 139 and diastolic all under 85. Rare dizzy episodes with standing- has been better since drinking more fluids  Home cuff validated- actually about 10 points higher on systolic and diastolic than our reading BP Readings from Last 3 Encounters:  02/04/17 (!) 150/78  10/14/16 (!) 160/82  01/31/16 (!) 150/70  A/P:Continue current meds:  Appears well controlled at home- white coat elevations in office

## 2017-02-04 NOTE — Progress Notes (Signed)
Phone: [phone]  Subjective:  Patient presents today for their annual physical. Chief complaint-noted.   See problem oriented charting- ROS- full  review of systems was completed and negative except for: some right ankle pain at times. No chest pain or shortness of breath  The following were reviewed and entered/updated in epic: Past Medical History:  Diagnosis Date  . Ankylosing spondylitis (HCC)   . Diverticulosis of colon (without mention of hemorrhage)   . Hypertension   . Rheumatoid arthritis(714.0)   . Unspecified hearing loss    Patient Active Problem List   Diagnosis Date Noted  . BPH associated with nocturia 11/26/2014    Priority: Medium  . Hyperglycemia 11/26/2014    Priority: Medium  . Essential hypertension 12/30/2009    Priority: Medium  . Bowel incontinence 07/04/2015    Priority: Low  . Hearing loss 06/06/2008    Priority: Low  . Ankylosing spondylitis (HCC) 10/25/2007    Priority: Low   Past Surgical History:  Procedure Laterality Date  . ADENOIDECTOMY    . INGUINAL HERNIA REPAIR     x3   . REVISION TOTAL HIP ARTHROPLASTY  1978/1992   3 hip replacements  . TONSILLECTOMY      Family History  Problem Relation Age of Onset  . Heart disease Mother   . Diabetes Mother   . Diabetes Father   . Prostate cancer Brother        x2  . Diabetes Sister        and migraines    Medications- reviewed and updated Current Outpatient Prescriptions  Medication Sig Dispense Refill  . aspirin 81 MG tablet Take 162 mg by mouth daily.    . hydrochlorothiazide (HYDRODIURIL) 25 MG tablet take 1 tablet by mouth once daily 180 tablet 1  . Zoster Vac Recomb Adjuvanted [facility](SHINGRIX) injection Inject 0.5 mLs into the muscle once. Repeat injection in 2-6 months. Please inform [place] when completed. 0.5 mL 1   No current facility-administered medications for this visit.     Allergies-reviewed and updated No Known Allergies  Social History   Social History  .  Marital status: Married    Spouse name: N/A  . Number of children: N/A  . Years of education: N/A   Occupational History  . retired    Social History Main Topics  . Smoking status: Never Smoker  . Smokeless tobacco: Never Used  . Alcohol use 0.0 oz/week     Comment: rare beer  . Drug use: No  . Sexual activity: Not Asked   Other Topics Concern  . None   Social History Narrative   Married (wife pt outside [employer]). 1 son. 3 grandkids.       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 Oropharynx normal. Hearing aid left ear. Tm normal after removed.  Neck: no thyromegaly CV: RRR no murmurs rubs or gallops Lungs: CTAB no crackles, wheeze, rhonchi Abdomen: soft/nontender/nondistended/normal bowel sounds. No rebound or guarding.  Ext: no edema Skin: warm, dry. Waist up and knee down  exam today without any obvious precancerous or cancerous lesions Neuro: grossly normal, moves all extremities, PERRLA  Assessment/Plan:  81 y.o. male presenting for annual physical.  Health Maintenance counseling: 1. Anticipatory guidance: Patient counseled regarding regular dental exams q37months, eye exams - 2 years since last visit- encouraged follow up, wearing seatbelts.  2. Risk factor reduction:  Advised patient of need for regular exercise and diet rich and fruits and vegetables to reduce risk of heart attack and stroke. Exercise- active in yard but encouraged regular exercise like stationary bike - goal long term 30 minutes 5 days a week- short term 10-15 minutes a day to start. Diet-wants to cut down  on salty snacks.  Wt Readings from Last 3 Encounters:  02/04/17 213 lb 12.8 oz (97 kg)  10/14/16 214 lb 12.8 oz (97.4 kg)  01/31/16 212 lb (96.2 kg)  3. Immunizations/screenings/ancillary studies- offered shingrix - will get at pharmacy Immunization History  Administered Date(s) Administered  . Influenza Split 09/27/2012  . Influenza Whole 06/06/2008, 07/03/2010  . Influenza,inj,Quad PF,36+ Mos 06/26/2014, 07/04/2015  . Influenza-Unspecified 08/05/2016  . Pneumococcal Conjugate-13 11/26/2014  . Pneumococcal Polysaccharide-23 08/28/2004  . Td 08/22/1999, 07/03/2010  . Zoster 06/06/2008  4. Prostate cancer screening- at this point with trend being somewhat stable and rectal exams showing BPH. And nocturia being slightly better than 2x a night- we opted to stop prostate cancer screening since risks likely outweigh benefits.  Lab Results  Component Value Date   PSA 2.77 01/24/2016   PSA 2.33 11/26/2014   PSA 2.47 11/15/2013   5. Colon cancer screening - refer to [facility]- last year did not get colonoscopy as preferred in .  6. Skin cancer screening- does not want to see dermatology. Waist up exam without any obvious precancer or cancer.   Status of chronic or acute concerns   Ankylosing spondylitis- 3 hip replacements, fused vertebrae. Prior rheumatoid diagnosis later changed by Dr. Cato Mulligan to ankylosing spondylitis- has been doing well recently. Hips bother him enough to avoid walking- may trial biking  Bowel incontinence- stable to slightly better - declines referral  Essential hypertension S: controlled on hctz 25 mg alone at home. Home readings 130s to 140s for most part - in month of may has had only one reading above 150 and that is in our office today. Last 13 #s average to 139 and diastolic all under 85. Rare dizzy episodes with standing- has been better since drinking more fluids  Home cuff validated- actually about 10 points higher on systolic and diastolic than our  reading BP Readings from Last 3 Encounters:  02/04/17 (!) 150/78  10/14/16 (!) 160/82  01/31/16 (!) 150/70  A/P:Continue current meds:  Appears well controlled at home- white coat elevations in office  Hyperglycemia Hyperglycemia Wt Readings from Last 3 Encounters:  02/04/17 213 lb 12.8 oz (97 kg)  10/14/16 214 lb 12.8 oz (97.4 kg)  01/31/16 212 lb (96.2 kg)  discussed importance of weight loss- update a1c and cbg Encouraged need for healthy eating, regular exercise, weight loss.   6 month blood pressure recheck  Orders Placed This Encounter  Procedures  . CBC    [place]  . Comprehensive metabolic panel    [place]    Order Specific Question:   Has the patient fasted?    Answer:   No  . Lipid panel    [place]    Order Specific Question:   Has the patient fasted?    Answer:   No  .  Hemoglobin A1c    [place]  . Ambulatory referral to Gastroenterology    Referral Priority:   Routine    Referral Type:   Consultation    Referral Reason:   Specialty Services Required    Number of Visits Requested:   1    Meds ordered this encounter  Medications  . Zoster Vac Recomb Adjuvanted [facility]) injection    Sig: Inject 0.5 mLs into the muscle once. Repeat injection in 2-6 months. Please inform [place] when completed.    Dispense:  0.5 mL    Refill:  1    Return precautions advised.   Tana Conch, MD

## 2017-02-04 NOTE — Patient Instructions (Addendum)
Please stop by lab before you go  Shingrix #1 at your pharmacy. Repeat injection in 2-3 months. Let us know when you get these  If you would like: You can sign up for an annual wellness visit with our nurse, Darl PikesSusan, who specializes in the annual wellness exam. This is a free benefit under medicare that may help us find additional ways to help you. Some highlights are reviewing medications, lifestyle, and doing a dementia screen.   We could also do this visit around the time of your next visit at [facility]  ______________________________________________________________________  Starting October 1st 2018, I will be transferring to our new location: [facility] [address] ([address] and [address]- [place] from [place] [place]) [place], [place] [id number] Phone: [phone]  I would love to have you remain my patient at this new location as long as it remains convenient for you. I am excited about the opportunity to have x-ray and sports medicine in the new building but will really miss the awesome staff and physicians at [facility]. Continue to schedule appointments at [facility] and we will automatically transfer them to the [facility] location starting October 1st.

## 2017-02-04 NOTE — Assessment & Plan Note (Signed)
Hyperglycemia Wt Readings from Last 3 Encounters:  02/04/17 213 lb 12.8 oz (97 kg)  10/14/16 214 lb 12.8 oz (97.4 kg)  01/31/16 212 lb (96.2 kg)  discussed importance of weight loss- update a1c and cbg Encouraged need for healthy eating, regular exercise, weight loss.

## 2017-03-03 DIAGNOSIS — Z1211 Encounter for screening for malignant neoplasm of colon: Secondary | ICD-10-CM | POA: Diagnosis not present

## 2017-05-20 ENCOUNTER — Encounter: Payer: Self-pay | Admitting: Family Medicine

## 2017-05-20 ENCOUNTER — Ambulatory Visit (INDEPENDENT_AMBULATORY_CARE_PROVIDER_SITE_OTHER): Payer: Medicare Other | Admitting: Family Medicine

## 2017-05-20 VITALS — BP 144/76 | HR 66 | Temp 97.9°F | Ht 70.75 in | Wt 201.8 lb

## 2017-05-20 DIAGNOSIS — N401 Enlarged prostate with lower urinary tract symptoms: Secondary | ICD-10-CM | POA: Diagnosis not present

## 2017-05-20 DIAGNOSIS — I10 Essential (primary) hypertension: Secondary | ICD-10-CM

## 2017-05-20 DIAGNOSIS — R351 Nocturia: Secondary | ICD-10-CM | POA: Diagnosis not present

## 2017-05-20 DIAGNOSIS — R739 Hyperglycemia, unspecified: Secondary | ICD-10-CM

## 2017-05-20 LAB — POCT GLYCOSYLATED HEMOGLOBIN (HGB A1C): HEMOGLOBIN A1C: 6

## 2017-05-20 NOTE — Assessment & Plan Note (Signed)
S: nocturia at least 2-4x a night. Weak stream. Urgency when gets up from standing. Did not tolerate flomax- rhinitis and orthostasis.  A/P: wants to sit down and discuss with urology. Wants to talk with urology about options- discussed trial finasteride and he declines

## 2017-05-20 NOTE — Progress Notes (Signed)
Subjective:  Jason [name] is a 81 y.o. year old very pleasant male patient who presents for/with See problem oriented charting ROS- No chest pain or shortness of breath. No headache or blurry vision.  No hypoglycemia   Past Medical History-  Patient Active Problem List   Diagnosis Date Noted  . Hyperlipidemia 02/04/2017    Priority: Medium  . BPH associated with nocturia 11/26/2014    Priority: Medium  . Hyperglycemia 11/26/2014    Priority: Medium  . Essential hypertension 12/30/2009    Priority: Medium  . Bowel incontinence 07/04/2015    Priority: Low  . Hearing loss 06/06/2008    Priority: Low  . Ankylosing spondylitis (HCC) 10/25/2007    Priority: Low    Medications- reviewed and updated Current Outpatient Prescriptions  Medication Sig Dispense Refill  . aspirin 81 MG tablet Take 162 mg by mouth daily.    . hydrochlorothiazide (HYDRODIURIL) 25 MG tablet take 1 tablet by mouth once daily 180 tablet 1   No current facility-administered medications for this visit.     Objective: BP (!) 144/76 (BP Location: Left Arm, Patient Position: Sitting, Cuff Size: Large)   Pulse 66   Temp 97.9 F (36.6 C) (Oral)   Ht 5' 10.75" (1.797 m)   Wt 201 lb 12.8 oz (91.5 kg)   SpO2 97%   BMI 28.34 kg/m  Gen: NAD, resting comfortably CV: RRR no murmurs rubs or gallops Lungs: CTAB no crackles, wheeze, rhonchi Ext: no edema Skin: warm, dry  Assessment/Plan:  BPH associated with nocturia S: nocturia at least 2-4x a night. Weak stream. Urgency when gets up from standing. Did not tolerate flomax- rhinitis and orthostasis.  A/P: wants to sit down and discuss with urology. Wants to talk with urology about options- discussed trial finasteride and he declines  Essential hypertension S: controlled on hctz 25mg . Home #s still looking ok- checking less frequently but has been at goal.  BP Readings from Last 3 Encounters:  05/20/17 (!) 144/76  02/04/17 (!) 150/78  10/14/16 (!) 160/82   A/P: We discussed blood pressure goal of <140/90. Continue current meds:  Encouraged at least weekly monitoring due to white coat element so want to make sure controlled at home on regular basis  Hyperglycemia S:  Patient has lost 12 lbs since a1c. He cut down on sugary drinks- threw them all away- mostly water now. Also cut out late night snacks. Those were key for him Lab Results  Component Value Date   HGBA1C 6.7 (H) 02/04/2017  A/P:  Lab Results  Component Value Date   HGBA1C 6.0 05/20/2017  drastic improvement- will not label him as having diabetes given improvement and prior non elevated fasting CBG to DM levels. Recheck a1c in late january  Return in about 20 weeks (around 10/07/2017) for follow up- or sooner if needed.  Orders Placed This Encounter  Procedures  . Ambulatory referral to Urology    Referral Priority:   Routine    Referral Type:   Consultation    Referral Reason:   Specialty Services Required    Requested Specialty:   Urology    Number of Visits Requested:   1  . POCT glycosylated hemoglobin (Hb A1C)   Return precautions advised.  Tana ConchStephen Symeon Puleo, MD

## 2017-05-20 NOTE — Assessment & Plan Note (Signed)
S:  Patient has lost 12 lbs since a1c. He cut down on sugary drinks- threw them all away- mostly water now. Also cut out late night snacks. Those were key for him Lab Results  Component Value Date   HGBA1C 6.7 (H) 02/04/2017  A/P:  Lab Results  Component Value Date   HGBA1C 6.0 05/20/2017  drastic improvement- will not label him as having diabetes given improvement and prior non elevated fasting CBG to DM levels. Recheck a1c in late january

## 2017-05-20 NOTE — Patient Instructions (Addendum)
We will call you within a week or two about your referral to urology to discuss enlarged prostate. If you do not hear within 3 weeks, give us a call.   Make sure to check blood pressure at least once a week with goal <140/90  GREAT job with weight loss and healthier eating. We can avoid calling this diabetes due to your significant progress. Keep up your efforts.

## 2017-05-20 NOTE — Assessment & Plan Note (Signed)
S: controlled on hctz 25mg . Home #s still looking ok- checking less frequently but has been at goal.  BP Readings from Last 3 Encounters:  05/20/17 (!) 144/76  02/04/17 (!) 150/78  10/14/16 (!) 160/82  A/P: We discussed blood pressure goal of <140/90. Continue current meds:  Encouraged at least weekly monitoring due to white coat element so want to make sure controlled at home on regular basis

## 2017-06-02 ENCOUNTER — Encounter: Payer: Self-pay | Admitting: Family Medicine

## 2017-07-25 ENCOUNTER — Encounter: Payer: Self-pay | Admitting: Family Medicine

## 2017-07-27 DIAGNOSIS — Z23 Encounter for immunization: Secondary | ICD-10-CM | POA: Diagnosis not present

## 2017-09-01 DIAGNOSIS — R3912 Poor urinary stream: Secondary | ICD-10-CM | POA: Diagnosis not present

## 2017-09-01 DIAGNOSIS — R3915 Urgency of urination: Secondary | ICD-10-CM | POA: Diagnosis not present

## 2017-09-01 DIAGNOSIS — N401 Enlarged prostate with lower urinary tract symptoms: Secondary | ICD-10-CM | POA: Diagnosis not present

## 2017-11-04 ENCOUNTER — Telehealth: Payer: Self-pay | Admitting: Family Medicine

## 2017-11-04 NOTE — Telephone Encounter (Signed)
MEDICATION: hydrochlorothiazide (HYDRODIURIL) 25 MG tablet  PHARMACY:   [facility] - [place], [place] - [address] [phone] (Phone) [phone] (Fax)     IS THIS A 90 DAY SUPPLY : yes  IS PATIENT OUT OF MEDICATION:   IF NOT; HOW MUCH IS LEFT:   LAST APPOINTMENT DATE: @08 /30/18  NEXT APPOINTMENT DATE:@Visit  date not found  OTHER COMMENTS:    **Let patient know to contact pharmacy at the end of the day to make sure medication is ready. **  ** Please notify patient to allow 48-72 hours to process**  **Encourage patient to contact the pharmacy for refills or they can request refills through [facility]**

## 2017-11-05 MED ORDER — HYDROCHLOROTHIAZIDE 25 MG PO TABS: 25.0000 mg | ORAL_TABLET | Freq: Every day | ORAL | 1 refills | Status: DC

## 2017-11-05 NOTE — Telephone Encounter (Signed)
Rx sent to pharmacy   

## 2017-11-24 DIAGNOSIS — N401 Enlarged prostate with lower urinary tract symptoms: Secondary | ICD-10-CM | POA: Diagnosis not present

## 2017-11-24 DIAGNOSIS — R3912 Poor urinary stream: Secondary | ICD-10-CM | POA: Diagnosis not present

## 2017-11-24 DIAGNOSIS — R3915 Urgency of urination: Secondary | ICD-10-CM | POA: Diagnosis not present

## 2018-03-11 ENCOUNTER — Ambulatory Visit (INDEPENDENT_AMBULATORY_CARE_PROVIDER_SITE_OTHER): Payer: Medicare Other | Admitting: Family Medicine

## 2018-03-11 ENCOUNTER — Encounter: Payer: Self-pay | Admitting: Family Medicine

## 2018-03-11 VITALS — BP 124/82 | HR 70 | Temp 97.9°F | Ht 70.5 in | Wt 200.2 lb

## 2018-03-11 DIAGNOSIS — I10 Essential (primary) hypertension: Secondary | ICD-10-CM

## 2018-03-11 DIAGNOSIS — R739 Hyperglycemia, unspecified: Secondary | ICD-10-CM

## 2018-03-11 DIAGNOSIS — Z0000 Encounter for general adult medical examination without abnormal findings: Secondary | ICD-10-CM

## 2018-03-11 DIAGNOSIS — M450 Ankylosing spondylitis of multiple sites in spine: Secondary | ICD-10-CM

## 2018-03-11 DIAGNOSIS — E785 Hyperlipidemia, unspecified: Secondary | ICD-10-CM | POA: Diagnosis not present

## 2018-03-11 LAB — CBC
HCT: 41 % (ref 39.0–52.0)
Hemoglobin: 14.2 g/dL (ref 13.0–17.0)
MCHC: 34.7 g/dL (ref 30.0–36.0)
MCV: 92.6 fl (ref 78.0–100.0)
Platelets: 261 10*3/uL (ref 150.0–400.0)
RBC: 4.42 Mil/uL (ref 4.22–5.81)
RDW: 13.2 % (ref 11.5–15.5)
WBC: 7.3 10*3/uL (ref 4.0–10.5)

## 2018-03-11 LAB — COMPREHENSIVE METABOLIC PANEL
ALK PHOS: 81 U/L (ref 39–117)
ALT: 13 U/L (ref 0–53)
AST: 16 U/L (ref 0–37)
Albumin: 4.5 g/dL (ref 3.5–5.2)
BUN: 20 mg/dL (ref 6–23)
CO2: 30 meq/L (ref 19–32)
Calcium: 9.6 mg/dL (ref 8.4–10.5)
Chloride: 98 mEq/L (ref 96–112)
Creatinine, Ser: 1.14 mg/dL (ref 0.40–1.50)
GFR: 65.69 mL/min (ref >60.00)
GLUCOSE: 100 mg/dL — AB (ref 70–99)
Potassium: 4.2 mEq/L (ref 3.5–5.1)
SODIUM: 136 meq/L (ref 135–145)
Total Bilirubin: 0.7 mg/dL (ref 0.2–1.2)
Total Protein: 7.8 g/dL (ref 6.0–8.3)

## 2018-03-11 LAB — POC URINALSYSI DIPSTICK (AUTOMATED)
BILIRUBIN UA: NEGATIVE
Blood, UA: NEGATIVE
GLUCOSE UA: NEGATIVE
LEUKOCYTES UA: NEGATIVE
Nitrite, UA: NEGATIVE
Protein, UA: NEGATIVE
Spec Grav, UA: 1.02 (ref 1.010–1.025)
UROBILINOGEN UA: 0.2 U/dL
pH, UA: 6.5 (ref 5.0–8.0)

## 2018-03-11 LAB — HEMOGLOBIN A1C: Hgb A1c MFr Bld: 6.3 % (ref 4.6–6.5)

## 2018-03-11 LAB — LIPID PANEL
CHOLESTEROL: 189 mg/dL (ref 0–200)
HDL: 40.5 mg/dL (ref >39.00)
LDL Cholesterol: 120 mg/dL — ABNORMAL HIGH (ref 0–99)
NonHDL: 148.5
TRIGLYCERIDES: 144 mg/dL (ref 0.0–149.0)
Total CHOL/HDL Ratio: 5
VLDL: 28.8 mg/dL (ref 0.0–40.0)

## 2018-03-11 MED ORDER — AMLODIPINE BESYLATE 5 MG PO TABS: 5.0000 mg | ORAL_TABLET | Freq: Every day | ORAL | 5 refills | Status: DC

## 2018-03-11 NOTE — Patient Instructions (Addendum)
Stop hydrochlorothiazide Start amlodipine 5mg  If home blood pressure running over 150 consistently then let us know and we can try 10mg  as long as you dont have too many side effects from the amlodipine  See me back in 4-8 weeks  One other option would be to sign up for a wellness visit in about a month where they will check your blood pressure. This visit is with one of our nurses, Cassie or Darl PikesSusan, who both specialize in the annual wellness visit. This is a free benefit under medicare that may help us find additional ways to help you. Some highlights are reviewing medications, lifestyle, and doing a dementia screen.  If your blood pressure was >140/90 at that time I would not want you to leave before I could recheck the blood pressure on you  Please stop by the lab before you go.

## 2018-03-11 NOTE — Assessment & Plan Note (Signed)
Ankylosing spondylitis- 3 hip replacements, fused vertebrae. Prior rheumatoid diagnisis later changed to ankylosing spondylitis by Dr. Cato MulliganSwords- has been doing well recently. Hips bother him with extended walking. Ok with typical activities

## 2018-03-11 NOTE — Assessment & Plan Note (Signed)
Update lipids but unlikely to start meds for primary prevention at age 82

## 2018-03-11 NOTE — Progress Notes (Signed)
Please let us know by responding to this when you get this mychart message and let us know if you understand the results/directions  Your CBC was normal (blood counts, infection fighting cells, platelets). Your CMET was largely normal (kidney, liver, and electrolytes, blood sugar) other than blood sugar being slightly high.   At risk for diabetes with hemoglobin a1c of 6.3 (at risk from 5.7-6.4). Healthy eating, regular exercise, weight loss advised. Above 6 we could consider metformin to help prevent diabetes. Luckily your risk is lower than a year ago due to your weight loss- our team can send in metformin 500mg  daily if you would like to be more aggressive about preventing diabetes outside of weight loss.   Your cholesterol is slightly worse but at age 82 I would not recommend cholesterol medicine since you have never had a heart attack or stroke.  Your urine was normal

## 2018-03-11 NOTE — Assessment & Plan Note (Signed)
S: controlled on hctz 25mg  here and at home. Home cuff about 10 points higher than our readings on past checks- has not been checking lately.  BP Readings from Last 3 Encounters:  03/11/18 124/82  05/20/17 (!) 144/76  02/04/17 (!) 150/78  A/P: Stop hydrochlorothiazide due to BPH/polyuria/OAB issues Start amlodipine 5mg  If home blood pressure running over 150 consistently then let us know and we can try 10mg  as long as you dont have too many side effects from the amlodipine

## 2018-03-11 NOTE — Progress Notes (Signed)
Phone: [phone]  Subjective:  Patient [phone] today for their annual physical. Chief complaint-noted.   See problem oriented charting- ROS- full  review of systems was completed and negative except for: pain with urinating, frequency urination, urinary urgency, sleep disturbance  The following were reviewed and entered/updated in epic: Past Medical History:  Diagnosis Date  . Ankylosing spondylitis (HCC)   . Diverticulosis of colon (without mention of hemorrhage)   . Hypertension   . Rheumatoid arthritis(714.0)   . Unspecified hearing loss    Patient Active Problem List   Diagnosis Date Noted  . Hyperlipidemia 02/04/2017    Priority: Medium  . BPH associated with nocturia 11/26/2014    Priority: Medium  . Hyperglycemia 11/26/2014    Priority: Medium  . Essential hypertension 12/30/2009    Priority: Medium  . Bowel incontinence 07/04/2015    Priority: Low  . Hearing loss 06/06/2008    Priority: Low  . Ankylosing spondylitis (HCC) 10/25/2007    Priority: Low   Past Surgical History:  Procedure Laterality Date  . ADENOIDECTOMY    . INGUINAL HERNIA REPAIR     x3   . REVISION TOTAL HIP ARTHROPLASTY  1978/1992   3 hip replacements  . TONSILLECTOMY      Family History  Problem Relation Age of Onset  . Heart disease Mother   . Diabetes Mother   . Diabetes Father   . Prostate cancer Brother        x2  . Diabetes Sister        and migraines    Medications- reviewed and updated Current Outpatient Medications  Medication Sig Dispense Refill  . hctz 25mg  daily 30 tablet 5  . TOVIAZ 8 MG TB24 tablet Take 8 mg by mouth daily.  11   No current facility-administered medications for this visit.     Allergies-reviewed and updated No Known Allergies  Social History   Social History Narrative   Married (wife pt outside practice). 1 son. 3 grandkids.       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 Oropharynx normal Neck: no thyromegaly CV: RRR no murmurs rubs or gallops Lungs: CTAB no crackles, wheeze, rhonchi Abdomen: soft/nontender/nondistended/normal bowel sounds. No rebound or guarding. overweight Ext: no edema Skin: warm, dry Neuro: grossly normal, moves all extremities, PERRLA  Assessment/Plan:  82 y.o. male presenting for annual physical.  Health Maintenance counseling: 1. Anticipatory guidance: Patient counseled regarding regular dental exams -q6 months, eye exams - still hasnt been seen- plans to do this, wearing seatbelts.  2. Risk factor reduction:  Advised patient of need for regular exercise and diet rich and fruits and vegetables to reduce risk of heart attack and stroke. Exercise- doing 30 minutes everyday- thinks this has helped him feel better. Diet-weight down 13 lbs in last year- diet pretty stable- reasonably healthy.  Wt Readings from Last 3 Encounters:  03/11/18 200 lb 3.2 oz (90.8 kg)  05/20/17 201 lb 12.8 oz (91.5 kg)  02/04/17 213 lb 12.8 oz (97 kg)  3. Immunizations/screenings/ancillary  studies- up to date Immunization History  Administered Date(s) Administered  . Influenza Split 09/27/2012  . Influenza Whole 06/06/2008, 07/03/2010  . Influenza,inj,Quad PF,6+ Mos 06/26/2014, 07/04/2015  . Influenza-Unspecified 08/05/2016  . Pneumococcal Conjugate-13 11/26/2014  . Pneumococcal Polysaccharide-23 08/28/2004  . Td 08/22/1999, 07/03/2010  . Zoster 06/06/2008  . Zoster Recombinat (Shingrix) 02/05/2017, 05/10/2017  4. Prostate cancer screening-  BPH on exam previously. Nocturia 2x a night last year. Last year we did not  screen given prior stability. He is now seeing [facility] Dr. Laverle Patter- he is on flomax and Gala Murdoch- thought there is some overactive bladder. Recently he was advised to trial off the flomax to see if that makes symptoms worse or not. Still no PSA testing per urology. Has July follow up. Very slight burning- we will get a UA with his labs.  5. Colon cancer screening - referred to [facility] last year and he was advised no futher colonoscopy per their report. No blood in stool.  6. Skin cancer screening- does not want to see dermatology- waist up exam here today. advised regular sunscreen use. Denies worrisome, changing, or new skin lesions.   Status of chronic or acute concerns   Bowel incontinence- stable . Doesn't want workup  Wakes up 2-3 to go to bathroom and 5 am but laying back down at 5 Am cant get back to sleep so gets up. Goes to bed at 11 30. We will see if we can help by stopping hctz  Essential hypertension S: controlled on hctz 25mg  here and at home. Home cuff about 10 points higher than our readings on past checks- has not been checking lately.  BP Readings from Last 3 Encounters:  03/11/18 124/82  05/20/17 (!) 144/76  02/04/17 (!) 150/78  A/P: Stop hydrochlorothiazide due to BPH/polyuria/OAB issues Start amlodipine 5mg  If home blood pressure running over 150 consistently then let us know and we can try 10mg  as long as you dont have too many side effects from the amlodipine   Hyperglycemia Hyperglycemia- will update a1c. Hopeful stable to improved with weight loss. Did not diagnose diabetes based off 1x a1c. Down 13 lbs Lab Results  Component Value Date   HGBA1C 6.0 05/20/2017   HGBA1C 6.7 (H) 02/04/2017   HGBA1C 6.3 07/03/2010     Hyperlipidemia Update lipids but unlikely to start meds for primary prevention at age 7   Ankylosing spondylitis Ankylosing spondylitis- 3 hip replacements, fused vertebrae. Prior rheumatoid diagnisis later changed to ankylosing  spondylitis by Dr. Cato Mulligan- has been doing well recently. Hips bother him with extended walking. Ok with typical activities  Either see me 4-8 weeks or get BP check with [facility]- wouldn't want him to leave before I see #s if elevated and get chance to repeat BP  Lab/Order associations: Preventative health care  Essential hypertension - Plan: POCT Urinalysis Dipstick (Automated)  Hyperlipidemia, unspecified hyperlipidemia type - Plan: Lipid panel, CBC, Comprehensive metabolic panel, POCT Urinalysis Dipstick (Automated), Comprehensive metabolic panel, CBC, Lipid panel  Hyperglycemia - Plan: Hemoglobin A1c, Hemoglobin A1c  Ankylosing spondylitis of multiple sites in spine (HCC)  Meds ordered this encounter  Medications  . amLODipine (NORVASC) 5 MG tablet    Sig: Take 1 tablet (5 mg total) by mouth daily.    Dispense:  30 tablet    Refill:  5   Return precautions advised.  Tana Conch, MD

## 2018-03-11 NOTE — Assessment & Plan Note (Signed)
Hyperglycemia- will update a1c. Hopeful stable to improved with weight loss. Did not diagnose diabetes based off 1x a1c. Down 13 lbs Lab Results  Component Value Date   HGBA1C 6.0 05/20/2017   HGBA1C 6.7 (H) 02/04/2017   HGBA1C 6.3 07/03/2010

## 2018-03-24 ENCOUNTER — Encounter: Payer: Self-pay | Admitting: Family Medicine

## 2018-03-30 DIAGNOSIS — N401 Enlarged prostate with lower urinary tract symptoms: Secondary | ICD-10-CM | POA: Diagnosis not present

## 2018-03-30 DIAGNOSIS — R3915 Urgency of urination: Secondary | ICD-10-CM | POA: Diagnosis not present

## 2018-05-06 ENCOUNTER — Ambulatory Visit: Payer: Medicare Other | Admitting: Family Medicine

## 2018-05-06 ENCOUNTER — Encounter: Payer: Self-pay | Admitting: Family Medicine

## 2018-05-06 VITALS — BP 138/70 | HR 61 | Temp 98.4°F | Ht 70.5 in | Wt 202.4 lb

## 2018-05-06 DIAGNOSIS — I10 Essential (primary) hypertension: Secondary | ICD-10-CM | POA: Diagnosis not present

## 2018-05-06 DIAGNOSIS — E785 Hyperlipidemia, unspecified: Secondary | ICD-10-CM | POA: Diagnosis not present

## 2018-05-06 DIAGNOSIS — R739 Hyperglycemia, unspecified: Secondary | ICD-10-CM

## 2018-05-06 DIAGNOSIS — R202 Paresthesia of skin: Secondary | ICD-10-CM | POA: Diagnosis not present

## 2018-05-06 LAB — CBC WITH DIFFERENTIAL/PLATELET
Basophils Absolute: 0 10*3/uL (ref 0.0–0.1)
Basophils Relative: 0.7 % (ref 0.0–3.0)
EOS ABS: 0.1 10*3/uL (ref 0.0–0.7)
EOS PCT: 1.5 % (ref 0.0–5.0)
HCT: 39.5 % (ref 39.0–52.0)
HEMOGLOBIN: 13.4 g/dL (ref 13.0–17.0)
LYMPHS ABS: 2 10*3/uL (ref 0.7–4.0)
Lymphocytes Relative: 29.4 % (ref 12.0–46.0)
MCHC: 33.9 g/dL (ref 30.0–36.0)
MCV: 92.2 fl (ref 78.0–100.0)
MONO ABS: 0.5 10*3/uL (ref 0.1–1.0)
Monocytes Relative: 7.3 % (ref 3.0–12.0)
NEUTROS PCT: 61.1 % (ref 43.0–77.0)
Neutro Abs: 4.3 10*3/uL (ref 1.4–7.7)
Platelets: 237 10*3/uL (ref 150.0–400.0)
RBC: 4.29 Mil/uL (ref 4.22–5.81)
RDW: 13.4 % (ref 11.5–15.5)
WBC: 7 10*3/uL (ref 4.0–10.5)

## 2018-05-06 LAB — COMPREHENSIVE METABOLIC PANEL
ALBUMIN: 4.4 g/dL (ref 3.5–5.2)
ALK PHOS: 69 U/L (ref 39–117)
ALT: 13 U/L (ref 0–53)
AST: 15 U/L (ref 0–37)
BILIRUBIN TOTAL: 0.7 mg/dL (ref 0.2–1.2)
BUN: 17 mg/dL (ref 6–23)
CO2: 29 mEq/L (ref 19–32)
Calcium: 9.7 mg/dL (ref 8.4–10.5)
Chloride: 102 mEq/L (ref 96–112)
Creatinine, Ser: 1.13 mg/dL (ref 0.40–1.50)
GFR: 66.33 mL/min (ref >60.00)
GLUCOSE: 104 mg/dL — AB (ref 70–99)
Potassium: 4.4 mEq/L (ref 3.5–5.1)
SODIUM: 137 meq/L (ref 135–145)
TOTAL PROTEIN: 7.7 g/dL (ref 6.0–8.3)

## 2018-05-06 LAB — TSH: TSH: 2.13 u[IU]/mL (ref 0.35–4.50)

## 2018-05-06 LAB — HEMOGLOBIN A1C: Hgb A1c MFr Bld: 6 % (ref 4.6–6.5)

## 2018-05-06 LAB — VITAMIN B12: VITAMIN B 12: 148 pg/mL — AB (ref 211–911)

## 2018-05-06 NOTE — Assessment & Plan Note (Signed)
S: last visit with worsening control despite weight loss Exercise and diet- patient lost 13 lbs last visit and is now up 2 lbs.  Lab Results  Component Value Date   HGBA1C 6.3 03/11/2018   HGBA1C 6.0 05/20/2017   HGBA1C 6.7 (H) 02/04/2017   A/P: getting close to us calling this DM - if he hits 6.5 again discussed would label it as such. Discussed metformin if a1c 6.4. If 6.3 he wants to keep working on diet/exercise- encourge dhim to reverse weight gain trend

## 2018-05-06 NOTE — Patient Instructions (Addendum)
Please stop by lab before you go We are going to see if any clear cause of burning in the feet This will also check in on your diabetes risk level/potential diabetes  Blood pressure look fine today  4-6 month follow up. Sooner if a1c higher.

## 2018-05-06 NOTE — Progress Notes (Signed)
Subjective:  Jason [name] is a 82 y.o. year old very pleasant male patient who presents for/with See problem oriented charting ROS- No chest pain or shortness of breath. No headache or blurry vision.  Continued urinary frequency   Past Medical History-  Patient Active Problem List   Diagnosis Date Noted  . Hyperlipidemia 02/04/2017    Priority: Medium  . BPH associated with nocturia 11/26/2014    Priority: Medium  . Hyperglycemia 11/26/2014    Priority: Medium  . Essential hypertension 12/30/2009    Priority: Medium  . Bowel incontinence 07/04/2015    Priority: Low  . Hearing loss 06/06/2008    Priority: Low  . Ankylosing spondylitis (HCC) 10/25/2007    Priority: Low  . Paresthesias 05/06/2018    Medications- reviewed and updated Current Outpatient Medications  Medication Sig Dispense Refill  . amLODipine (NORVASC) 5 MG tablet Take 1 tablet (5 mg total) by mouth daily. 30 tablet 5  . Dutasteride-Tamsulosin HCl 0.5-0.4 MG CAPS Take 1 tablet by mouth daily.     . TOVIAZ 8 MG TB24 tablet Take 8 mg by mouth daily.  11   Objective: BP 138/70 (BP Location: Left Arm, Patient Position: Sitting, Cuff Size: Large)   Pulse 61   Temp 98.4 F (36.9 C) (Oral)   Ht 5' 10.5" (1.791 m)   Wt 202 lb 6.1 oz (91.8 kg)   SpO2 95%   BMI 28.63 kg/m  Gen: NAD, resting comfortably CV: RRR no murmurs rubs or gallops Lungs: CTAB no crackles, wheeze, rhonchi Abdomen: soft/nontender/nondistended/normal bowel sounds. No rebound or guarding.  Ext: trace edema Skin: warm, dry  Msk: no back pain   Assessment/Plan:  Essential hypertension S: controlled on amlodipine 5mg . We stopped hctz due to urinary issues- bph/polyuria/OAB. Home #s <139/76 on all readings this month at home with many #s in 120s over 60s BP Readings from Last 3 Encounters:  05/06/18 138/70  03/11/18 124/82  05/20/17 (!) 144/76  A/P: We discussed blood pressure goal of <140/90. Continue current meds:  Unfortunately not  helping urination issues very much  Hyperglycemia S: last visit with worsening control despite weight loss Exercise and diet- patient lost 13 lbs last visit and is now up 2 lbs.  Lab Results  Component Value Date   HGBA1C 6.3 03/11/2018   HGBA1C 6.0 05/20/2017   HGBA1C 6.7 (H) 02/04/2017   A/P: getting close to us calling this DM - if he hits 6.5 again discussed would label it as such. Discussed metformin if a1c 6.4. If 6.3 he wants to keep working on diet/exercise- encourge dhim to reverse weight gain trend  Paresthesias S: when on his feet for a long period gets burning on the bottom of his feet. Denies back pain- does have known ankylosing spondylitis. Asks about neuropathy A/P: this certainly could be neuropathy (suspect most likely idiopathic) - we will get some basic labs as below- he is not interested in meds for this just wants more information  4-6 months  Lab/Order associations: Paresthesias - Plan: TSH, Vitamin B12, Hemoglobin A1c, CBC with Differential/Platelet, Comprehensive metabolic panel  Hyperlipidemia, unspecified hyperlipidemia type - Plan: TSH, CBC with Differential/Platelet, Comprehensive metabolic panel  Hyperglycemia - Plan: Hemoglobin A1c  Essential hypertension  Return precautions advised.  Tana ConchStephen Isaack Preble, MD

## 2018-05-06 NOTE — Assessment & Plan Note (Signed)
S: when on his feet for a long period gets burning on the bottom of his feet. Denies back pain- does have known ankylosing spondylitis. Asks about neuropathy A/P: this certainly could be neuropathy (suspect most likely idiopathic) - we will get some basic labs as below- he is not interested in meds for this just wants more information

## 2018-05-06 NOTE — Assessment & Plan Note (Signed)
S: controlled on amlodipine 5mg . We stopped hctz due to urinary issues- bph/polyuria/OAB. Home #s <139/76 on all readings this month at home with many #s in 120s over 60s BP Readings from Last 3 Encounters:  05/06/18 138/70  03/11/18 124/82  05/20/17 (!) 144/76  A/P: We discussed blood pressure goal of <140/90. Continue current meds:  Unfortunately not helping urination issues very much

## 2018-05-13 ENCOUNTER — Other Ambulatory Visit: Payer: Medicare Other

## 2018-05-13 ENCOUNTER — Ambulatory Visit (INDEPENDENT_AMBULATORY_CARE_PROVIDER_SITE_OTHER): Payer: Medicare Other

## 2018-05-13 ENCOUNTER — Telehealth: Payer: Self-pay

## 2018-05-13 DIAGNOSIS — E539 Vitamin B deficiency, unspecified: Secondary | ICD-10-CM

## 2018-05-13 MED ORDER — CYANOCOBALAMIN 1000 MCG/ML IJ SOLN
1000.0000 ug | Freq: Once | INTRAMUSCULAR | Status: AC
  Administered 2018-05-13: 1000 ug via INTRAMUSCULAR

## 2018-05-13 NOTE — Telephone Encounter (Signed)
-----   Message from Shelva MajesticStephen O Hunter, MD sent at 05/13/2018 11:04 AM EDT ----- Regarding: RE: B12 Tablet We may use tablets later- for now can just do injections.  ----- Message ----- From: Era SkeenWomble, Baylee Mccorkel L, CMA Sent: 05/13/2018  10:59 AM EDT To: Shelva MajesticStephen O Hunter, MD Subject: B12 Tablet                                     Patient was here for his first B12 injection today. He asked is his supposed to be taking the injections along with a B12 tablet or just the injections? I told him I would ask you and let him know.

## 2018-05-13 NOTE — Progress Notes (Signed)
Patient here today for his first B12 injection. Injection was administered in his left deltoid by Marlowe AschoffAria Womble, CMA. Patient tolerated well. He will return in about a week for second injection.

## 2018-05-13 NOTE — Telephone Encounter (Signed)
Called patient and informed him to only do the injections for right now. Patient verbalized understanding.

## 2018-05-15 ENCOUNTER — Other Ambulatory Visit: Payer: Self-pay | Admitting: Family Medicine

## 2018-05-20 ENCOUNTER — Encounter: Payer: Self-pay | Admitting: Surgical

## 2018-05-20 ENCOUNTER — Ambulatory Visit (INDEPENDENT_AMBULATORY_CARE_PROVIDER_SITE_OTHER): Payer: Medicare Other | Admitting: Surgical

## 2018-05-20 DIAGNOSIS — E539 Vitamin B deficiency, unspecified: Secondary | ICD-10-CM | POA: Diagnosis not present

## 2018-05-20 MED ORDER — CYANOCOBALAMIN 1000 MCG/ML IJ SOLN
1000.0000 ug | Freq: Once | INTRAMUSCULAR | Status: AC
  Administered 2018-05-20: 1000 ug via INTRAMUSCULAR

## 2018-05-20 NOTE — Progress Notes (Signed)
Patient comes in today for a B 12 injection. B 12 given in right deltoid. Patient tolerated injection well.

## 2018-05-26 ENCOUNTER — Ambulatory Visit (INDEPENDENT_AMBULATORY_CARE_PROVIDER_SITE_OTHER): Payer: Medicare Other

## 2018-05-26 DIAGNOSIS — E539 Vitamin B deficiency, unspecified: Secondary | ICD-10-CM

## 2018-05-26 MED ORDER — CYANOCOBALAMIN 1000 MCG/ML IJ SOLN
1000.0000 ug | Freq: Once | INTRAMUSCULAR | Status: AC
  Administered 2018-05-26: 1000 ug via INTRAMUSCULAR

## 2018-05-26 NOTE — Progress Notes (Signed)
Per orders of Dr. Durene Cal , injection of B-12 given by Donnamarie Poag. Patient tolerated injection well. Injection given in left deltoid. Patient will make 4th injection appointment for one week.

## 2018-06-02 ENCOUNTER — Ambulatory Visit (INDEPENDENT_AMBULATORY_CARE_PROVIDER_SITE_OTHER): Payer: Medicare Other

## 2018-06-02 ENCOUNTER — Telehealth: Payer: Self-pay

## 2018-06-02 DIAGNOSIS — E539 Vitamin B deficiency, unspecified: Secondary | ICD-10-CM

## 2018-06-02 MED ORDER — CYANOCOBALAMIN 1000 MCG/ML IJ SOLN
1000.0000 ug | Freq: Once | INTRAMUSCULAR | Status: AC
  Administered 2018-06-02: 1000 ug via INTRAMUSCULAR

## 2018-06-02 NOTE — Telephone Encounter (Signed)
Patient in office today for 4th b-12. He wanted to know if now that he was done with weekly injections if he could go to oral supplements or if you wanted him to stay on injections monthly? Informed patient that I would send message and call with response once received.

## 2018-06-02 NOTE — Telephone Encounter (Signed)
Lets get a b12 level on him under b12 deficiency within next week. Depending on levels we can consider oral supplementation vs. Continued monthly injections - I suspect will be reasonable to go to 1000 mcg over the counter pill of b12

## 2018-06-02 NOTE — Telephone Encounter (Signed)
Please order the below and help set him up

## 2018-06-02 NOTE — Progress Notes (Signed)
Per orders of Dr. Durene CalHunter, injection of b-12 given by Donnamarie PoagJoellen Y Thompson. Patient tolerated injection well. Injection given in right deltoid. No problems. Patient did have question about next injection sent my phone note to provider.

## 2018-06-03 ENCOUNTER — Other Ambulatory Visit: Payer: Self-pay | Admitting: Family Medicine

## 2018-06-03 DIAGNOSIS — E538 Deficiency of other specified B group vitamins: Secondary | ICD-10-CM

## 2018-06-03 NOTE — Telephone Encounter (Signed)
Spoke to pt, told him calling in regards to B12 injections. Told him Dr. Durene CalHunter would like to repeat your B12 level and see where you are. Depending on results will see whether you need to continue shots or go on oral supplement. Pt verbalized understanding. Asked pt when he can come in for lab test? Pt said Tuesday around 10-10:30. Told pt okay Lab  appt scheduled for Tuesday at 10:30 for B12 level. Pt verbalized understanding.

## 2018-06-07 ENCOUNTER — Ambulatory Visit (INDEPENDENT_AMBULATORY_CARE_PROVIDER_SITE_OTHER): Payer: Medicare Other

## 2018-06-07 ENCOUNTER — Other Ambulatory Visit (INDEPENDENT_AMBULATORY_CARE_PROVIDER_SITE_OTHER): Payer: Medicare Other

## 2018-06-07 DIAGNOSIS — Z23 Encounter for immunization: Secondary | ICD-10-CM

## 2018-06-07 DIAGNOSIS — E538 Deficiency of other specified B group vitamins: Secondary | ICD-10-CM | POA: Diagnosis not present

## 2018-06-07 LAB — VITAMIN B12: VITAMIN B 12: 641 pg/mL (ref 211–911)

## 2018-06-07 NOTE — Patient Instructions (Signed)
There are no preventive care reminders to display for this patient.  [facility] 03/11/2018 02/04/2017 01/31/2016  Decreased Interest 0 0 0  Down, Depressed, Hopeless 0 0 0  PHQ - 2 Score 0 0 0

## 2018-06-07 NOTE — Progress Notes (Signed)
Patient in today for Flu shot. Administered in right arm. Tolerated well. VIS given

## 2018-06-21 DIAGNOSIS — N401 Enlarged prostate with lower urinary tract symptoms: Secondary | ICD-10-CM | POA: Diagnosis not present

## 2018-06-21 DIAGNOSIS — R3912 Poor urinary stream: Secondary | ICD-10-CM | POA: Diagnosis not present

## 2018-06-21 DIAGNOSIS — R3915 Urgency of urination: Secondary | ICD-10-CM | POA: Diagnosis not present

## 2018-06-28 ENCOUNTER — Ambulatory Visit (INDEPENDENT_AMBULATORY_CARE_PROVIDER_SITE_OTHER): Payer: Medicare Other

## 2018-06-28 DIAGNOSIS — E538 Deficiency of other specified B group vitamins: Secondary | ICD-10-CM | POA: Diagnosis not present

## 2018-06-28 DIAGNOSIS — R3915 Urgency of urination: Secondary | ICD-10-CM | POA: Diagnosis not present

## 2018-06-28 DIAGNOSIS — N401 Enlarged prostate with lower urinary tract symptoms: Secondary | ICD-10-CM | POA: Diagnosis not present

## 2018-06-28 DIAGNOSIS — R3912 Poor urinary stream: Secondary | ICD-10-CM | POA: Diagnosis not present

## 2018-06-28 MED ORDER — CYANOCOBALAMIN 1000 MCG/ML IJ SOLN
1000.0000 ug | Freq: Once | INTRAMUSCULAR | Status: AC
  Administered 2018-06-28: 1000 ug via INTRAMUSCULAR

## 2018-06-28 NOTE — Patient Instructions (Signed)
There are no preventive care reminders to display for this patient.  Depression screen PHQ 2/9 03/11/2018 02/04/2017 01/31/2016  Decreased Interest 0 0 0  Down, Depressed, Hopeless 0 0 0  PHQ - 2 Score 0 0 0   

## 2018-06-28 NOTE — Progress Notes (Signed)
Patient here today for B12 injection due to B12 deficiency. Administered in left arm. Tolerated well. 

## 2018-07-13 DIAGNOSIS — R3915 Urgency of urination: Secondary | ICD-10-CM | POA: Diagnosis not present

## 2018-07-13 DIAGNOSIS — N401 Enlarged prostate with lower urinary tract symptoms: Secondary | ICD-10-CM | POA: Diagnosis not present

## 2018-07-22 DIAGNOSIS — R3915 Urgency of urination: Secondary | ICD-10-CM | POA: Diagnosis not present

## 2018-07-22 DIAGNOSIS — N401 Enlarged prostate with lower urinary tract symptoms: Secondary | ICD-10-CM | POA: Diagnosis not present

## 2018-08-02 ENCOUNTER — Other Ambulatory Visit: Payer: Self-pay | Admitting: Family Medicine

## 2018-09-21 HISTORY — PX: CATARACT EXTRACTION, BILATERAL: SHX1313

## 2018-10-22 HISTORY — PX: OTHER SURGICAL HISTORY: SHX169

## 2018-10-31 DIAGNOSIS — N401 Enlarged prostate with lower urinary tract symptoms: Secondary | ICD-10-CM | POA: Diagnosis not present

## 2018-10-31 DIAGNOSIS — R3915 Urgency of urination: Secondary | ICD-10-CM | POA: Diagnosis not present

## 2018-11-07 DIAGNOSIS — Z0120 Encounter for dental examination and cleaning without abnormal findings: Secondary | ICD-10-CM | POA: Diagnosis not present

## 2019-02-06 DIAGNOSIS — N401 Enlarged prostate with lower urinary tract symptoms: Secondary | ICD-10-CM | POA: Diagnosis not present

## 2019-02-06 DIAGNOSIS — R350 Frequency of micturition: Secondary | ICD-10-CM | POA: Diagnosis not present

## 2019-03-13 ENCOUNTER — Encounter: Payer: Self-pay | Admitting: Family Medicine

## 2019-03-13 ENCOUNTER — Other Ambulatory Visit: Payer: Self-pay

## 2019-03-13 ENCOUNTER — Ambulatory Visit (INDEPENDENT_AMBULATORY_CARE_PROVIDER_SITE_OTHER): Payer: Medicare Other | Admitting: Family Medicine

## 2019-03-13 VITALS — BP 138/68 | HR 65 | Temp 98.1°F | Resp 16 | Ht 70.5 in | Wt 203.4 lb

## 2019-03-13 DIAGNOSIS — R739 Hyperglycemia, unspecified: Secondary | ICD-10-CM

## 2019-03-13 DIAGNOSIS — E785 Hyperlipidemia, unspecified: Secondary | ICD-10-CM

## 2019-03-13 DIAGNOSIS — I10 Essential (primary) hypertension: Secondary | ICD-10-CM

## 2019-03-13 DIAGNOSIS — E663 Overweight: Secondary | ICD-10-CM

## 2019-03-13 DIAGNOSIS — M450 Ankylosing spondylitis of multiple sites in spine: Secondary | ICD-10-CM

## 2019-03-13 DIAGNOSIS — Z0000 Encounter for general adult medical examination without abnormal findings: Secondary | ICD-10-CM

## 2019-03-13 NOTE — Patient Instructions (Addendum)
No changes today  Please bring your cuff with you to next visit- may be you just run higher in the office for your blood pressure  Please stop by lab before you go If you do not have mychart- we will call you about results within 5 business days of [place] receiving them.  If you have mychart- we will send your results within 3 business days of [place] receiving them.  If abnormal or we want to clarify a result, we will call or mychart you to make sure you receive the message.  If you have questions or concerns or don't hear within 5-7 days, please send [place] a message or call us.

## 2019-03-13 NOTE — Progress Notes (Signed)
Phone: [phone]   Subjective:  Patient presents today for their annual physical. Chief complaint-noted.   See problem oriented charting- ROS- full  review of systems was completed and negative except for: urinary frequenc and urgency, hearing loss  The following were reviewed and entered/updated in epic: Past Medical History:  Diagnosis Date  . Ankylosing spondylitis ([place])   . Diverticulosis of colon (without mention of hemorrhage)   . Hypertension   . Rheumatoid arthritis(714.0)   . Unspecified hearing loss    Patient Active Problem List   Diagnosis Date Noted  . Hyperlipidemia 02/04/2017    Priority: Medium  . BPH associated with nocturia 11/26/2014    Priority: Medium  . Hyperglycemia 11/26/2014    Priority: Medium  . Essential hypertension 12/30/2009    Priority: Medium  . Bowel incontinence 07/04/2015    Priority: Low  . Hearing loss 06/06/2008    Priority: Low  . Ankylosing spondylitis ([place]) 10/25/2007    Priority: Low  . Paresthesias 05/06/2018   Past Surgical History:  Procedure Laterality Date  . ADENOIDECTOMY    . INGUINAL HERNIA REPAIR     x3   . OTHER SURGICAL HISTORY  10/2018   urolift  . REVISION TOTAL HIP ARTHROPLASTY  1978/1992   3 hip replacements  . TONSILLECTOMY      Family History  Problem Relation Age of Onset  . Heart disease Mother   . Diabetes Mother   . Diabetes Father   . Prostate cancer Brother        x2  . Diabetes Sister        and migraines    Medications- reviewed and updated Current Outpatient Medications  Medication Sig Dispense Refill  . amLODipine (NORVASC) 5 MG tablet TAKE 1 TABLET(5 MG) BY MOUTH DAILY 90 tablet 3  . loratadine (ALLERGY) 10 MG tablet Take 10 mg by mouth daily.     No current facility-administered medications for this visit.     Allergies-reviewed and updated No Known Allergies  Social History   Social History Narrative   Married (wife pt outside practice). 1 son. 3 grandkids.       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 Oropharynx normal. Wears hearing aids- TM normal when removed  Neck: no thyromegaly or cervical lymphadenopathy CV: RRR no murmurs rubs or gallops Lungs: CTAB no crackles, wheeze, rhonchi Abdomen: soft/nontender/nondistended/normal bowel sounds. No rebound or guarding.  Ext: no edema Skin: warm, dry Neuro: grossly normal, moves all extremities, PERRLA    Assessment and Plan  83 y.o. male presenting for annual physical.  Health Maintenance counseling: 1. Anticipatory guidance: Patient counseled regarding regular dental exams -q6 months, eye exams -yearly,  avoiding smoking and second hand smoke , limiting alcohol to 2 beverages per day - less than 1 a week.   2. Risk factor reduction:  Advised patient of need for regular exercise and diet rich and fruits and vegetables to reduce risk of heart attack and stroke. Exercise- 30 minutes on stationary bike daily. Diet-stable weight over last year- Stable. Continue current  medications. .  Wt Readings from Last 3 Encounters:  03/13/19 203 lb 6.4 oz (92.3 kg)  05/06/18 202 lb 6.1 oz (91.8 kg)  03/11/18 200 lb 3.2 oz (90.8 kg)  3. Immunizations/screenings/ancillary studies- up to date Immunization History  Administered Date(s) Administered  . Influenza Split 09/27/2012  . Influenza Whole 06/06/2008, 07/03/2010  . Influenza, High Dose Seasonal PF 06/07/2018  . Influenza,inj,Quad PF,6+ Mos 06/26/2014, 07/04/2015  . Influenza-Unspecified 08/05/2016  . Pneumococcal Conjugate-13 11/26/2014  . Pneumococcal Polysaccharide-23 08/28/2004  . Td 08/22/1999, 07/03/2010   . Zoster 06/06/2008  . Zoster Recombinat (Shingrix) 02/05/2017, 05/10/2017  4. Prostate cancer screening- passed age based screening recommendations. Working with Dr. Laverle PatterBorden on frequency- ended up with uroloft and only helped initially. They went back to OAB diagnosis but he has not done well with medications (oxybutynin). Now just tolerating frequency he is thankful to be off medications and not have dizziness. He may ultimately discontinue urology check ups.  5. Colon cancer screening -  passed age based screenin recommendations -last in 2005 and then saw Dr. Charm BargesButler a few years later has been released 6. Skin cancer screening- no dermatologist- declines referral today. Waist up exam today - no obvious cancer or precancer. advised regular sunscreen use. Denies worrisome, changing, or new skin lesions.  7. never smoker   Status of chronic or acute concerns   Hypertension- stopped hctz due to urinary issues. uses amlodipine but not all time- encouraged consistent use. Home #s 125-135/65-75. Runs slightly higher here- will verify home cuff next visit BP Readings from Last 3 Encounters:  03/13/19 138/68  05/06/18 138/70  03/11/18 124/82    Hyperglycemia- update a1c today- had lost weight 2 visits ago but trending up this visit and last- would label as diabetes if hits a1c 6.5 again- he does not want to start metformin unless a1c 6.4 Lab Results  Component Value Date   HGBA1C 6.0 05/06/2018   hyperlipidemia noted but no clear evidence over age 83 to start statin for primary prevention  Bowel incontinence from the past has resolved  Ankylosing spondylitis- 3 hip replacements and fused vertebrae. Dr. Cato MulliganSwords updated diagnosis to ankylosing spondylitis from rheumatoid arthritis. Low back pain if works in yard for long time . Stable recently - doing ok with his typical activities.   Tingling in feet issues persist- was bothering him with yardwork- only bothering him if out most of the day (got  someone to help him with yard)   6 month follow up Lab/Order associations:fasting    ICD-10-CM   1. Preventative health care  Z00.00 CBC    Comprehensive metabolic panel    Lipid panel    Hemoglobin A1c  2. Hyperlipidemia, unspecified hyperlipidemia type  E78.5 CBC    Comprehensive metabolic panel    Lipid panel  3. Hyperglycemia  R73.9 Hemoglobin A1c  4. Essential hypertension  I10   5. Ankylosing spondylitis of multiple sites in spine (HCC)  M45.0   6. Overweight  E66.3     No orders of the defined types were placed in this encounter.   Return precautions advised.  Tana ConchStephen Nayib Remer, MD

## 2019-03-14 LAB — COMPREHENSIVE METABOLIC PANEL
ALT: 17 U/L (ref 0–53)
AST: 16 U/L (ref 0–37)
Albumin: 4.4 g/dL (ref 3.5–5.2)
Alkaline Phosphatase: 86 U/L (ref 39–117)
BUN: 13 mg/dL (ref 6–23)
CO2: 29 mEq/L (ref 19–32)
Calcium: 9.2 mg/dL (ref 8.4–10.5)
Chloride: 100 mEq/L (ref 96–112)
Creatinine, Ser: 1.02 mg/dL (ref 0.40–1.50)
GFR: 70.09 mL/min (ref >60.00)
Glucose, Bld: 102 mg/dL — ABNORMAL HIGH (ref 70–99)
Potassium: 4.4 mEq/L (ref 3.5–5.1)
Sodium: 137 mEq/L (ref 135–145)
Total Bilirubin: 0.5 mg/dL (ref 0.2–1.2)
Total Protein: 7.1 g/dL (ref 6.0–8.3)

## 2019-03-14 LAB — LIPID PANEL
Cholesterol: 178 mg/dL (ref 0–200)
HDL: 38 mg/dL — ABNORMAL LOW (ref >39.00)
LDL Cholesterol: 114 mg/dL — ABNORMAL HIGH (ref 0–99)
NonHDL: 140.22
Total CHOL/HDL Ratio: 5
Triglycerides: 129 mg/dL (ref 0.0–149.0)
VLDL: 25.8 mg/dL (ref 0.0–40.0)

## 2019-03-14 LAB — CBC
HCT: 41.1 % (ref 39.0–52.0)
Hemoglobin: 13.8 g/dL (ref 13.0–17.0)
MCHC: 33.5 g/dL (ref 30.0–36.0)
MCV: 94 fl (ref 78.0–100.0)
Platelets: 276 10*3/uL (ref 150.0–400.0)
RBC: 4.38 Mil/uL (ref 4.22–5.81)
RDW: 13.3 % (ref 11.5–15.5)
WBC: 7 10*3/uL (ref 4.0–10.5)

## 2019-03-14 LAB — HEMOGLOBIN A1C: Hgb A1c MFr Bld: 6.2 % (ref 4.6–6.5)

## 2019-05-08 DIAGNOSIS — Z0120 Encounter for dental examination and cleaning without abnormal findings: Secondary | ICD-10-CM | POA: Diagnosis not present

## 2019-06-15 ENCOUNTER — Ambulatory Visit (INDEPENDENT_AMBULATORY_CARE_PROVIDER_SITE_OTHER): Payer: Medicare Other

## 2019-06-15 ENCOUNTER — Other Ambulatory Visit: Payer: Self-pay

## 2019-06-15 DIAGNOSIS — Z23 Encounter for immunization: Secondary | ICD-10-CM | POA: Diagnosis not present

## 2019-06-15 NOTE — Patient Instructions (Signed)
Health Maintenance Due  Topic Date Due  . INFLUENZA VACCINE  04/22/2019    Depression screen [facility] 2/9 03/13/2019 03/11/2018 02/04/2017  Decreased Interest 0 0 0  Down, Depressed, Hopeless 0 0 0  PHQ - 2 Score 0 0 0

## 2019-07-05 DIAGNOSIS — M25552 Pain in left hip: Secondary | ICD-10-CM | POA: Diagnosis not present

## 2019-07-11 ENCOUNTER — Ambulatory Visit (INDEPENDENT_AMBULATORY_CARE_PROVIDER_SITE_OTHER): Payer: Medicare Other

## 2019-07-11 ENCOUNTER — Other Ambulatory Visit: Payer: Self-pay

## 2019-07-11 VITALS — BP 136/72 | Temp 98.0°F | Ht 71.0 in | Wt 207.0 lb

## 2019-07-11 DIAGNOSIS — Z0000 Encounter for general adult medical examination without abnormal findings: Secondary | ICD-10-CM | POA: Diagnosis not present

## 2019-07-11 NOTE — Progress Notes (Signed)
I have reviewed and agree with note, evaluation, plan.  I appreciate the advice on blood pressure monitoring and follow-up  Garret Reddish, MD

## 2019-07-11 NOTE — Progress Notes (Signed)
Subjective:   Jason [name] is a 83 y.o. male who presents for Medicare Annual/Subsequent preventive examination.  Review of Systems:   Cardiac Risk Factors include: advanced age (>21men, >38 women);hypertension;male gender     Objective:    Vitals: BP 136/72 (BP Location: Left Arm, Patient Position: Sitting, Cuff Size: Normal)   Temp 98 F (36.7 C) (Temporal)   Ht 5\' 11"  (1.803 m)   Wt 207 lb (93.9 kg)   BMI 28.87 kg/m   Body mass index is 28.87 kg/m.  Advanced Directives 07/11/2019  Does Patient Have a Medical Advance Directive? Yes  Type of Advance Directive Living will;Healthcare Power of Attorney  Does patient want to make changes to medical advance directive? No - Patient declined  Copy of [place] in Chart? No - copy requested    Tobacco Social History   Tobacco Use  Smoking Status Never Smoker  Smokeless Tobacco Never Used     Counseling given: Not Answered   Clinical Intake:  Pre-visit preparation completed: Yes  Pain : No/denies pain  Diabetes: No  How often do you need to have someone help you when you read instructions, pamphlets, or other written materials from your doctor or pharmacy?: 1 - Never  Interpreter Needed?: No  Information entered by :: Denman George LPN  Past Medical History:  Diagnosis Date  . Ankylosing spondylitis ([place])   . Diverticulosis of colon (without mention of hemorrhage)   . Hypertension   . Rheumatoid arthritis(714.0)   . Unspecified hearing loss    Past Surgical History:  Procedure Laterality Date  . ADENOIDECTOMY    . INGUINAL HERNIA REPAIR     x3   . OTHER SURGICAL HISTORY  10/2018   urolift  . REVISION TOTAL HIP ARTHROPLASTY  1978/1992   3 hip replacements  . TONSILLECTOMY     Family History  Problem Relation Age of Onset  . Heart disease Mother   . Diabetes Mother   . Diabetes Father   . Prostate cancer Brother        x2  . Diabetes Sister        and migraines    Social History   Socioeconomic History  . Marital status: Married    Spouse name: Not on file  . Number of children: Not on file  . Years of education: Not on file  . Highest education level: Not on file  Occupational History  . Occupation: retired  Scientific laboratory technician  . Financial resource strain: Not on file  . Food insecurity    Worry: Not on file    Inability: Not on file  . Transportation needs    Medical: Not on file    Non-medical: Not on file  Tobacco Use  . Smoking status: Never Smoker  . Smokeless tobacco: Never Used  Substance and Sexual Activity  . Alcohol use: Yes    Alcohol/week: 0.0 standard drinks    Comment: rare beer  . Drug use: No  . Sexual activity: Not on file  Lifestyle  . Physical activity    Days per week: Not on file    Minutes per session: Not on file  . Stress: Not on file  Relationships  . Social Herbalist on phone: Not on file    Gets together: Not on file    Attends religious service: Not on file    Active member of club or organization: Not on file    Attends meetings  of clubs or organizations: Not on file    Relationship status: Not on file  Other Topics Concern  . Not on file  Social History Narrative   Married (wife pt outside practice). 1 son. 3 grandkids.       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 amLODipine (NORVASC) 5 MG tablet TAKE 1 TABLET(5 MG) BY MOUTH DAILY  . loratadine (ALLERGY) 10 MG tablet Take 10 mg by mouth daily.   No facility-administered encounter medications on file as of 07/11/2019.     Activities of Daily Living In your present state of health, do you have any difficulty performing the following activities: 07/11/2019  Hearing? Y  Comment wears hearing aid  Vision? N  Difficulty concentrating or making decisions? N   Walking or climbing stairs? Y  Comment at times due to hip pain  Dressing or bathing? N  Doing errands, shopping? N  Preparing Food and eating ? N  Using the Toilet? N  In the past six months, have you accidently leaked urine? N  Do you have problems with loss of bowel control? N  Managing your Medications? N  Managing your Finances? N  Housekeeping or managing your Housekeeping? N  Some recent data might be hidden    Patient Care Team: Shelva MajesticHunter, Stephen O, MD as PCP - General (Family Medicine) Durene Romanslin, Matthew, MD as Consulting Physician (Orthopedic Surgery) Heloise PurpuraBorden, Lester, MD as Consulting Physician (Urology) [facility] [place]  as Consulting Physician (Ophthalmology)   Assessment:   This is a routine wellness examination for Jason [name].  Exercise Activities and Dietary recommendations Current Exercise Habits: Home exercise routine, Type of exercise: Other - see comments(yard work activities), Time (Minutes): 30, Frequency (Times/Week): 4, Weekly Exercise (Minutes/Week): 120, Intensity: Mild  Goals    . Patient Stated     Continue to maintain activity level        Fall Risk Fall Risk  07/11/2019 03/13/2019 03/11/2018 02/04/2017 01/31/2016  Falls in the past year? 0 0 No No No  Number falls in past yr: - 0 - - -  Injury with Fall? 0 0 - - -  Risk for fall due to : Impaired mobility - - - -  Follow up Falls evaluation completed;Education provided;Falls prevention discussed Falls evaluation completed - - -   Is the patient's home free of loose throw rugs in walkways, pet beds, electrical cords, etc?   yes      Grab bars in the bathroom? yes      Handrails on the stairs?   yes      Adequate lighting?   yes  Timed Get Up and Go Performed: completed and within normal timeframe; no gait abnormalities noted   Depression Screen PHQ 2/9 Scores 07/11/2019 03/13/2019 03/11/2018 02/04/2017  PHQ - 2 Score 0 0 0 0    Cognitive Function-no cognitive concerns at this time    6CIT  Screen 07/11/2019  What Year? 0 points  What month? 0 points  What time? 0 points  Count back from 20 0 points  Months in reverse 0 points  Repeat phrase 0 points  Total Score 0    Immunization History  Administered Date(s) Administered  . Fluad Quad(high Dose 65+) 06/15/2019  . Influenza Split 09/27/2012  . Influenza Whole 06/06/2008, 07/03/2010  . Influenza, High  Dose Seasonal PF 06/07/2018  . Influenza,inj,Quad PF,6+ Mos 06/26/2014, 07/04/2015  . Influenza-Unspecified 08/05/2016  . Pneumococcal Conjugate-13 11/26/2014  . Pneumococcal Polysaccharide-23 08/28/2004  . Td 08/22/1999, 07/03/2010  . Zoster 06/06/2008  . Zoster Recombinat (Shingrix) 02/05/2017, 05/10/2017    Qualifies for Shingles Vaccine? Shingrix completed   Screening Tests Health Maintenance  Topic Date Due  . TETANUS/TDAP  07/03/2020  . INFLUENZA VACCINE  Completed  . PNA vac Low Risk Adult  Completed   Cancer Screenings: Lung: Low Dose CT Chest recommended if Age 66-80 years, 30 pack-year currently smoking OR have quit w/in 15years. Patient does not qualify. Colorectal: no longer indicated      Plan:  I have personally reviewed and addressed the Medicare Annual Wellness questionnaire and have noted the following in the patient's chart:  A. Medical and social history B. Use of alcohol, tobacco or illicit drugs  C. Current medications and supplements D. Functional ability and status E.  Nutritional status F.  Physical activity G. Advance directives H. List of other physicians I.  Hospitalizations, surgeries, and ER visits in previous 12 months J.  Vitals K. Screenings such as hearing and vision if needed, cognitive and depression L. Referrals, records requested, and appointments- none   In addition, I have reviewed and discussed with patient certain preventive protocols, quality metrics, and best practice recommendations. A written personalized care plan for preventive services as well as general  preventive health recommendations were provided to patient.   Signed,  Kandis Fantasia, LPN  Nurse Health Advisor   Nurse Notes: Patient scheduled for December follow up visit with pcp to review hypertension.  Requested that patient log blood pressures x 1 week and bring with him to the appointment

## 2019-07-11 NOTE — Patient Instructions (Addendum)
Jason [name] , Thank you for taking time to come for your Medicare Wellness Visit. I appreciate your ongoing commitment to your health goals. Please review the following plan we discussed and let me know if I can assist you in the future.   Screening recommendations/referrals: Colorectal Screening: no longer indicated   Vision and Dental Exams: Recommended annual ophthalmology exams for early detection of glaucoma and other disorders of the eye Recommended annual dental exams for proper oral hygiene  Vaccinations: Influenza vaccine: completed 06/15/19 Pneumococcal vaccine: upu to date; last 11/26/14 Tdap vaccine: up to date; last 07/03/10 Shingles vaccine:Shingnrix completed   Advanced directives: Please bring a copy of your POA (Power of [place]) and/or Living Will to your next appointment.  Goals: Recommend to drink at least 6-8 8oz glasses of water per day and consume a balanced diet rich in fresh fruits and vegetables.   Next appointment: Please schedule your Annual Wellness Visit with your Nurse Health Advisor in one year. Please schedule an appointment for December with Dr Yong Channel to follow up on your blood pressure.  Please keep a log of your blood pressure daily for a week before and bring that to your appointment   Preventive Care 65 Years and Older, Male Preventive care refers to lifestyle choices and visits with your health care provider that can promote health and wellness. What does preventive care include?  A yearly physical exam. This is also called an annual well check.  Dental exams once or twice a year.  Routine eye exams. Ask your health care provider how often you should have your eyes checked.  Personal lifestyle choices, including:  Daily care of your teeth and gums.  Regular physical activity.  Eating a healthy diet.  Avoiding tobacco and drug use.  Limiting alcohol use.  Practicing safe sex.  Taking low doses of aspirin every day if recommended by  your health care provider..  Taking vitamin and mineral supplements as recommended by your health care provider. What happens during an annual well check? The services and screenings done by your health care provider during your annual well check will depend on your age, overall health, lifestyle risk factors, and family history of disease. Counseling  Your health care provider may ask you questions about your:  Alcohol use.  Tobacco use.  Drug use.  Emotional well-being.  Home and relationship well-being.  Sexual activity.  Eating habits.  History of falls.  Memory and ability to understand (cognition).  Work and work Statistician. Screening  You may have the following tests or measurements:  Height, weight, and BMI.  Blood pressure.  Lipid and cholesterol levels. These may be checked every 5 years, or more frequently if you are over 12 years old.  Skin check.  Lung cancer screening. You may have this screening every year starting at age 75 if you have a 30-pack-year history of smoking and currently smoke or have quit within the past 15 years.  Fecal occult blood test (FOBT) of the stool. You may have this test every year starting at age 73.  Flexible sigmoidoscopy or colonoscopy. You may have a sigmoidoscopy every 5 years or a colonoscopy every 10 years starting at age 60.  Prostate cancer screening. Recommendations will vary depending on your family history and other risks.  Hepatitis C blood test.  Hepatitis B blood test.  Sexually transmitted disease (STD) testing.  Diabetes screening. This is done by checking your blood sugar (glucose) after you have not eaten for a while (fasting).  You may have this done every 1-3 years.  Abdominal aortic aneurysm (AAA) screening. You may need this if you are a current or former smoker.  Osteoporosis. You may be screened starting at age 2 if you are at high risk. Talk with your health care provider about your test  results, treatment options, and if necessary, the need for more tests. Vaccines  Your health care provider may recommend certain vaccines, such as:  Influenza vaccine. This is recommended every year.  Tetanus, diphtheria, and acellular pertussis (Tdap, Td) vaccine. You may need a Td booster every 10 years.  Zoster vaccine. You may need this after age 60.  Pneumococcal 13-valent conjugate (PCV13) vaccine. One dose is recommended after age 3.  Pneumococcal polysaccharide (PPSV23) vaccine. One dose is recommended after age 13. Talk to your health care provider about which screenings and vaccines you need and how often you need them. This information is not intended to replace advice given to you by your health care provider. Make sure you discuss any questions you have with your health care provider. Document Released: 10/04/2015 Document Revised: 05/27/2016 Document Reviewed: 07/09/2015 Elsevier Interactive Patient Education  2017 [place] Prevention in the Home Falls can cause injuries. They can happen to people of all ages. There are many things you can do to make your home safe and to help prevent falls. What can I do on the outside of my home?  Regularly fix the edges of walkways and driveways and fix any cracks.  Remove anything that might make you trip as you walk through a door, such as a raised step or threshold.  Trim any bushes or trees on the path to your home.  Use bright outdoor lighting.  Clear any walking paths of anything that might make someone trip, such as rocks or tools.  Regularly check to see if handrails are loose or broken. Make sure that both sides of any steps have handrails.  Any raised decks and porches should have guardrails on the edges.  Have any leaves, snow, or ice cleared regularly.  Use sand or salt on walking paths during winter.  Clean up any spills in your garage right away. This includes oil or grease spills. What can I do in  the bathroom?  Use night lights.  Install grab bars by the toilet and in the tub and shower. Do not use towel bars as grab bars.  Use non-skid mats or decals in the tub or shower.  If you need to sit down in the shower, use a plastic, non-slip stool.  Keep the floor dry. Clean up any water that spills on the floor as soon as it happens.  Remove soap buildup in the tub or shower regularly.  Attach bath mats securely with double-sided non-slip rug tape.  Do not have throw rugs and other things on the floor that can make you trip. What can I do in the bedroom?  Use night lights.  Make sure that you have a light by your bed that is easy to reach.  Do not use any sheets or blankets that are too big for your bed. They should not hang down onto the floor.  Have a firm chair that has side arms. You can use this for support while you get dressed.  Do not have throw rugs and other things on the floor that can make you trip. What can I do in the kitchen?  Clean up any spills right away.  Avoid walking  on wet floors.  Keep items that you use a lot in easy-to-reach places.  If you need to reach something above you, use a strong step stool that has a grab bar.  Keep electrical cords out of the way.  Do not use floor polish or wax that makes floors slippery. If you must use wax, use non-skid floor wax.  Do not have throw rugs and other things on the floor that can make you trip. What can I do with my stairs?  Do not leave any items on the stairs.  Make sure that there are handrails on both sides of the stairs and use them. Fix handrails that are broken or loose. Make sure that handrails are as long as the stairways.  Check any carpeting to make sure that it is firmly attached to the stairs. Fix any carpet that is loose or worn.  Avoid having throw rugs at the top or bottom of the stairs. If you do have throw rugs, attach them to the floor with carpet tape.  Make sure that you  have a light switch at the top of the stairs and the bottom of the stairs. If you do not have them, ask someone to add them for you. What else can I do to help prevent falls?  Wear shoes that:  Do not have high heels.  Have rubber bottoms.  Are comfortable and fit you well.  Are closed at the toe. Do not wear sandals.  If you use a stepladder:  Make sure that it is fully opened. Do not climb a closed stepladder.  Make sure that both sides of the stepladder are locked into place.  Ask someone to hold it for you, if possible.  Clearly mark and make sure that you can see:  Any grab bars or handrails.  First and last steps.  Where the edge of each step is.  Use tools that help you move around (mobility aids) if they are needed. These include:  Canes.  Walkers.  Scooters.  Crutches.  Turn on the lights when you go into a dark area. Replace any light bulbs as soon as they burn out.  Set up your furniture so you have a clear path. Avoid moving your furniture around.  If any of your floors are uneven, fix them.  If there are any pets around you, be aware of where they are.  Review your medicines with your doctor. Some medicines can make you feel dizzy. This can increase your chance of falling. Ask your doctor what other things that you can do to help prevent falls. This information is not intended to replace advice given to you by your health care provider. Make sure you discuss any questions you have with your health care provider. Document Released: 07/04/2009 Document Revised: 02/13/2016 Document Reviewed: 10/12/2014 Elsevier Interactive Patient Education  2017 [employer].

## 2019-08-05 ENCOUNTER — Other Ambulatory Visit: Payer: Self-pay | Admitting: Family Medicine

## 2019-08-23 DIAGNOSIS — M47896 Other spondylosis, lumbar region: Secondary | ICD-10-CM | POA: Diagnosis not present

## 2019-08-23 DIAGNOSIS — M25552 Pain in left hip: Secondary | ICD-10-CM | POA: Diagnosis not present

## 2019-08-23 DIAGNOSIS — Z96642 Presence of left artificial hip joint: Secondary | ICD-10-CM | POA: Diagnosis not present

## 2019-08-23 DIAGNOSIS — M545 Low back pain: Secondary | ICD-10-CM | POA: Diagnosis not present

## 2019-09-11 ENCOUNTER — Other Ambulatory Visit: Payer: Self-pay

## 2019-09-11 ENCOUNTER — Encounter: Payer: Self-pay | Admitting: Family Medicine

## 2019-09-11 ENCOUNTER — Ambulatory Visit (INDEPENDENT_AMBULATORY_CARE_PROVIDER_SITE_OTHER): Payer: Medicare Other | Admitting: Family Medicine

## 2019-09-11 VITALS — BP 124/60 | HR 62 | Temp 98.1°F | Ht 71.0 in | Wt 206.2 lb

## 2019-09-11 DIAGNOSIS — I10 Essential (primary) hypertension: Secondary | ICD-10-CM | POA: Diagnosis not present

## 2019-09-11 DIAGNOSIS — R739 Hyperglycemia, unspecified: Secondary | ICD-10-CM

## 2019-09-11 DIAGNOSIS — N401 Enlarged prostate with lower urinary tract symptoms: Secondary | ICD-10-CM

## 2019-09-11 DIAGNOSIS — E785 Hyperlipidemia, unspecified: Secondary | ICD-10-CM

## 2019-09-11 DIAGNOSIS — R351 Nocturia: Secondary | ICD-10-CM

## 2019-09-11 NOTE — Patient Instructions (Addendum)
Please stop by lab before you go If you do not have mychart- we will call you about results within 5 business days of [place] receiving them.  If you have mychart- we will send your results within 3 business days of [place] receiving them.  If abnormal or we want to clarify a result, we will call or mychart you to make sure you receive the message.  If you have questions or concerns or don't hear within 5-7 days, please send [place] a message or call us.    No changes today. Blood pressure looks good. Weight up 3 lbs and need to watch that to help prevent diabetes. We agreed to consider metformin if a1c gets to 6.4- otherwise you wanted to work on lifestyle first.

## 2019-09-11 NOTE — Progress Notes (Signed)
Phone [phone] In person visit   Subjective:   Jason [name] is a 83 y.o. year old very pleasant male patient who presents for/with See problem oriented charting Chief Complaint  Patient presents with  . Follow-up  . Hypertension   ROS- No chest pain or shortness of breath. No headache or blurry vision.  Left buttocks/leg pain issues.   This visit occurred during the SARS-CoV-2 public health emergency.  Safety protocols were in place, including screening questions prior to the visit, additional usage of staff PPE, and extensive cleaning of exam room while observing appropriate contact time as indicated for disinfecting solutions.   Past Medical History-  Patient Active Problem List   Diagnosis Date Noted  . Hyperlipidemia 02/04/2017    Priority: Medium  . BPH associated with nocturia 11/26/2014    Priority: Medium  . Hyperglycemia 11/26/2014    Priority: Medium  . Essential hypertension 12/30/2009    Priority: Medium  . Bowel incontinence 07/04/2015    Priority: Low  . Hearing loss 06/06/2008    Priority: Low  . Ankylosing spondylitis ([place]) 10/25/2007    Priority: Low  . Paresthesias 05/06/2018    Medications- reviewed and updated Current Outpatient Medications  Medication Sig Dispense Refill  . oxybutynin (DITROPAN) 5 MG tablet Take 5 mg by mouth 3 (three) times daily.    Marland Kitchen amLODipine (NORVASC) 5 MG tablet TAKE 1 TABLET(5 MG) BY MOUTH DAILY 90 tablet 3  . loratadine (ALLERGY) 10 MG tablet Take 10 mg by mouth daily.     No current facility-administered medications for this visit.     Objective:  BP 124/60   Pulse 62   Temp 98.1 F (36.7 C)   Ht 5\' 11"  (1.803 m)   Wt 206 lb 3.2 oz (93.5 kg)   SpO2 95%   BMI 28.76 kg/m  Gen: NAD, resting comfortably CV: RRR no murmurs rubs or gallops Lungs: CTAB no crackles, wheeze, rhonchi Ext: no edema Skin: warm, dry    Assessment and Plan   #hyperlipidemia S: Not currently treated with medication.  We  reviewed prior numbers are elevated. No history of heart attack or stroke Lab Results  Component Value Date   CHOL 178 03/13/2019   HDL 38.00 (L) 03/13/2019   LDLCALC 114 (H) 03/13/2019   LDLDIRECT 127.0 11/26/2014   TRIG 129.0 03/13/2019   CHOLHDL 5 03/13/2019   A/P: Typically above age 10 we do not start statins for primary prevention.  Discussed importance of healthy eating/regular exercise  #hypertension S: compliant with amlodipine 5mg . Pt states diet and exercise are going good, he is riding his stationary bike and mostly cooks at home. Pt denies HA, Chest discomfort, blurred visit.  Urinary issues on hydrochlorothiazide in the past BP Readings from Last 3 Encounters:  09/11/19 124/60  07/11/19 136/72  03/13/19 138/68  A/P: Excellent control today-continue current medication   # Hyperglycemia/insulin resistance/prediabetes S: Exercise and diet- weight up 3 pounds from last visit. Riding stationary bike daily for 30 minutes. Trying to eat reasonably healthy but weight is up 3 lbs.   Lab Results  Component Value Date   HGBA1C 6.2 03/13/2019   HGBA1C 6.0 05/06/2018   HGBA1C 6.3 03/11/2018   A/P: hopefully controlled- update a1c- he only wants metformin if a1c gets up to 6.4   # Overactive bladder/urinary frequency S:started trying oxybutynin about a month ago- reports mild improvement.  He is only taking once a day A/P: improving control- he is considering increasing to twice a  day.    #reports from sciatic nerve issues in the fall- recommended follow up with Dr. Charlann Boxer  Recommended follow up: Return in 6 months (on 03/12/2020) for physical or sooner if needed.  Lab/Order associations:   ICD-10-CM   1. Hyperlipidemia, unspecified hyperlipidemia type  E78.5   2. Hyperglycemia  R73.9 Comprehensive metabolic panel    Hemoglobin A1c  3. Essential hypertension  I10 Comprehensive metabolic panel  4. BPH associated with nocturia  N40.1    R35.1     No orders of the defined  types were placed in this encounter.   Return precautions advised.  Tana Conch, MD

## 2019-09-12 LAB — COMPREHENSIVE METABOLIC PANEL
ALT: 13 U/L (ref 0–53)
AST: 15 U/L (ref 0–37)
Albumin: 4.3 g/dL (ref 3.5–5.2)
Alkaline Phosphatase: 74 U/L (ref 39–117)
BUN: 16 mg/dL (ref 6–23)
CO2: 26 mEq/L (ref 19–32)
Calcium: 9 mg/dL (ref 8.4–10.5)
Chloride: 102 mEq/L (ref 96–112)
Creatinine, Ser: 1.05 mg/dL (ref 0.40–1.50)
GFR: 67.7 mL/min (ref >60.00)
Glucose, Bld: 94 mg/dL (ref 70–99)
Potassium: 4.1 mEq/L (ref 3.5–5.1)
Sodium: 137 mEq/L (ref 135–145)
Total Bilirubin: 0.6 mg/dL (ref 0.2–1.2)
Total Protein: 7 g/dL (ref 6.0–8.3)

## 2019-09-12 LAB — HEMOGLOBIN A1C: Hgb A1c MFr Bld: 6.1 % (ref 4.6–6.5)

## 2020-03-18 ENCOUNTER — Other Ambulatory Visit: Payer: Self-pay

## 2020-03-18 ENCOUNTER — Ambulatory Visit (INDEPENDENT_AMBULATORY_CARE_PROVIDER_SITE_OTHER): Payer: Medicare Other | Admitting: Family Medicine

## 2020-03-18 ENCOUNTER — Encounter: Payer: Self-pay | Admitting: Family Medicine

## 2020-03-18 VITALS — BP 124/60 | HR 66 | Temp 98.1°F | Ht 71.0 in | Wt 202.2 lb

## 2020-03-18 DIAGNOSIS — Z0000 Encounter for general adult medical examination without abnormal findings: Secondary | ICD-10-CM | POA: Diagnosis not present

## 2020-03-18 DIAGNOSIS — N401 Enlarged prostate with lower urinary tract symptoms: Secondary | ICD-10-CM

## 2020-03-18 DIAGNOSIS — M450 Ankylosing spondylitis of multiple sites in spine: Secondary | ICD-10-CM

## 2020-03-18 DIAGNOSIS — I10 Essential (primary) hypertension: Secondary | ICD-10-CM | POA: Diagnosis not present

## 2020-03-18 DIAGNOSIS — E785 Hyperlipidemia, unspecified: Secondary | ICD-10-CM | POA: Diagnosis not present

## 2020-03-18 DIAGNOSIS — R351 Nocturia: Secondary | ICD-10-CM

## 2020-03-18 DIAGNOSIS — E538 Deficiency of other specified B group vitamins: Secondary | ICD-10-CM

## 2020-03-18 DIAGNOSIS — R739 Hyperglycemia, unspecified: Secondary | ICD-10-CM | POA: Diagnosis not present

## 2020-03-18 LAB — LIPID PANEL
Cholesterol: 184 mg/dL (ref 0–200)
HDL: 33.1 mg/dL — ABNORMAL LOW (ref >39.00)
LDL Cholesterol: 119 mg/dL — ABNORMAL HIGH (ref 0–99)
NonHDL: 151.17
Total CHOL/HDL Ratio: 6
Triglycerides: 161 mg/dL — ABNORMAL HIGH (ref 0.0–149.0)
VLDL: 32.2 mg/dL (ref 0.0–40.0)

## 2020-03-18 LAB — CBC WITH DIFFERENTIAL/PLATELET
Basophils Absolute: 0.1 10*3/uL (ref 0.0–0.1)
Basophils Relative: 1 % (ref 0.0–3.0)
Eosinophils Absolute: 0.2 10*3/uL (ref 0.0–0.7)
Eosinophils Relative: 2.8 % (ref 0.0–5.0)
HCT: 39.2 % (ref 39.0–52.0)
Hemoglobin: 13.2 g/dL (ref 13.0–17.0)
Lymphocytes Relative: 30.5 % (ref 12.0–46.0)
Lymphs Abs: 2 10*3/uL (ref 0.7–4.0)
MCHC: 33.6 g/dL (ref 30.0–36.0)
MCV: 92.8 fl (ref 78.0–100.0)
Monocytes Absolute: 0.5 10*3/uL (ref 0.1–1.0)
Monocytes Relative: 7.1 % (ref 3.0–12.0)
Neutro Abs: 3.8 10*3/uL (ref 1.4–7.7)
Neutrophils Relative %: 58.6 % (ref 43.0–77.0)
Platelets: 245 10*3/uL (ref 150.0–400.0)
RBC: 4.22 Mil/uL (ref 4.22–5.81)
RDW: 13.3 % (ref 11.5–15.5)
WBC: 6.4 10*3/uL (ref 4.0–10.5)

## 2020-03-18 LAB — COMPREHENSIVE METABOLIC PANEL
ALT: 14 U/L (ref 0–53)
AST: 14 U/L (ref 0–37)
Albumin: 4.3 g/dL (ref 3.5–5.2)
Alkaline Phosphatase: 80 U/L (ref 39–117)
BUN: 17 mg/dL (ref 6–23)
CO2: 27 mEq/L (ref 19–32)
Calcium: 9.2 mg/dL (ref 8.4–10.5)
Chloride: 102 mEq/L (ref 96–112)
Creatinine, Ser: 1 mg/dL (ref 0.40–1.50)
GFR: 71.53 mL/min (ref >60.00)
Glucose, Bld: 103 mg/dL — ABNORMAL HIGH (ref 70–99)
Potassium: 4.2 mEq/L (ref 3.5–5.1)
Sodium: 137 mEq/L (ref 135–145)
Total Bilirubin: 0.6 mg/dL (ref 0.2–1.2)
Total Protein: 7.2 g/dL (ref 6.0–8.3)

## 2020-03-18 LAB — HEMOGLOBIN A1C: Hgb A1c MFr Bld: 6.2 % (ref 4.6–6.5)

## 2020-03-18 LAB — VITAMIN B12: Vitamin B-12: 172 pg/mL — ABNORMAL LOW (ref 211–911)

## 2020-03-18 MED ORDER — AMLODIPINE BESYLATE 5 MG PO TABS: ORAL_TABLET | ORAL | 3 refills | Status: DC

## 2020-03-18 NOTE — Patient Instructions (Addendum)
Glad you are doing so well!   Please stop by lab before you go If you have mychart- we will send your results within 3 business days of [place] receiving them.  If you do not have mychart- we will call you about results within 5 business days of [place] receiving them.

## 2020-03-18 NOTE — Progress Notes (Signed)
Phone: [phone]   Subjective:  Patient presents today for their annual physical. Chief complaint-noted.   See problem oriented charting- ROS- full  review of systems was completed and negative  except for:  Some intermittent bak pain, continued polyuria  The following were reviewed and entered/updated in epic: Past Medical History:  Diagnosis Date  . Ankylosing spondylitis (HCC)   . Diverticulosis of colon (without mention of hemorrhage)   . Hypertension   . Rheumatoid arthritis(714.0)   . Unspecified hearing loss    Patient Active Problem List   Diagnosis Date Noted  . Hyperlipidemia 02/04/2017    Priority: Medium  . BPH associated with nocturia 11/26/2014    Priority: Medium  . Hyperglycemia 11/26/2014    Priority: Medium  . Essential hypertension 12/30/2009    Priority: Medium  . Bowel incontinence 07/04/2015    Priority: Low  . Hearing loss 06/06/2008    Priority: Low  . Ankylosing spondylitis (HCC) 10/25/2007    Priority: Low  . B12 deficiency 03/18/2020  . Paresthesias 05/06/2018   Past Surgical History:  Procedure Laterality Date  . ADENOIDECTOMY    . INGUINAL HERNIA REPAIR     x3   . OTHER SURGICAL HISTORY  10/2018   urolift  . REVISION TOTAL HIP ARTHROPLASTY  1978/1992   3 hip replacements  . TONSILLECTOMY      Family History  Problem Relation Age of Onset  . Heart disease Mother   . Diabetes Mother   . Diabetes Father   . Prostate cancer Brother        x2  . Diabetes Sister        and migraines    Medications- reviewed and updated Current Outpatient Medications  Medication Sig Dispense Refill  . amLODipine (NORVASC) 5 MG tablet TAKE 1 TABLET(5 MG) BY MOUTH DAILY 90 tablet 3  . loratadine (ALLERGY) 10 MG tablet Take 10 mg by mouth daily.     No current facility-administered medications for this visit.    Allergies-reviewed and updated No Known Allergies  Social History   Social History Narrative   Married (wife pt outside  practice). 1 son. 3 grandkids.       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 Oropharynx normal Neck: no thyromegaly CV: RRR no murmurs rubs or gallops Lungs: CTAB no crackles, wheeze, rhonchi Abdomen: soft/nontender/nondistended/normal bowel sounds. No rebound or guarding.  Ext: no edema Skin: warm, dry Neuro: grossly normal, moves all extremities, PERRLA   Assessment and Plan  84 y.o. male presenting for annual physical.  Health Maintenance counseling: 1. Anticipatory guidance: Patient counseled regarding regular dental exams q6 months, eye exams yearly,  avoiding smoking and second hand smoke , limiting alcohol to 2 beverages per day does not drink 2. Risk factor reduction:  Advised patient of need for regular exercise and diet rich and fruits and vegetables to reduce risk of heart attack and stroke. Exercise- stationary bike daily for 30 minutes. Diet-has tried to eat healthier and cut nighttime snacks-he is down 4 pounds today despite going on vacation last week to the beach!  Wt Readings from Last 3 Encounters:  03/18/20 202 lb 3.2 oz (91.7 kg)  09/11/19 206 lb 3.2 oz (93.5 kg)  07/11/19 207 lb (93.9 kg)   3. Immunizations/screenings/ancillary studies Immunization History  Administered Date(s) Administered  . Fluad Quad(high Dose 65+) 06/15/2019  . Influenza Split 09/27/2012  . Influenza Whole 06/06/2008, 07/03/2010  . Influenza, High Dose Seasonal PF 06/07/2018  . Influenza,inj,Quad PF,6+ Mos 06/26/2014, 07/04/2015  . Influenza-Unspecified 08/05/2016  . Moderna SARS-COVID-2 Vaccination 10/11/2019, 11/03/2019  . Pneumococcal Conjugate-13 11/26/2014    . Pneumococcal Polysaccharide-23 08/28/2004  . Td 08/22/1999, 07/03/2010  . Zoster 06/06/2008  . Zoster Recombinat (Shingrix) 02/05/2017, 05/10/2017   4. Prostate cancer screening- passed age based screening- reports had this done recently with urology anyway  Lab Results  Component Value Date   PSA 2.77 01/24/2016   PSA 2.33 11/26/2014   PSA 2.47 11/15/2013   5. Colon cancer screening - [facility] said no more colonoscopy 6. Skin cancer screening- does not see dermatology. advised regular sunscreen use. Denies worrisome, changing, or new skin lesions.  7. Never smoker 8. STD screening - opts out  Status of chronic or acute concerns   # Hyperglycemia/insulin resistance/prediabetes S:  Medication: none Exercise and diet- biking daily, trying to eat healthier Lab Results  Component Value Date   HGBA1C 6.1 09/11/2019   HGBA1C 6.2 03/13/2019   HGBA1C 6.0 05/06/2018   A/P: hopefully A1c is stable on labs today-continue to work on lifestyle changes-congratulated on weight loss  #hypertension S: medication: Amlodipine 5Mg  Home readings #s: does not check BP Readings from Last 3 Encounters:  03/18/20 124/60  09/11/19 124/60  07/11/19 136/72  A/P: Stable. Continue current medications.   #hyperlipidemia S: Medication:none  Lab Results  Component Value Date   CHOL 178 03/13/2019   HDL 38.00 (L) 03/13/2019   LDLCALC 114 (H) 03/13/2019   LDLDIRECT 127.0 11/26/2014   TRIG 129.0 03/13/2019   CHOLHDL 5 03/13/2019   A/P: Poor control but given he is over age 75 we have opted not to start new cholesterol medicine for primary prevention  BPH associated with nocturia- saw urology but nothing in particular seem to help- multiple medication trials.  He is no longer on oxybutynin.  He is just learned to tolerate this/live with it.  -cystoscopy not helpful -urolift not helpful  Ankylosing spondylitis of multiple sites in spine [facility])- intermittent back pain if really works hard. Had  some pain down 1 leg that was thought to be sciatic nerve- prior surgeries deemed stable.   #Low B12-history in 2019 of B12 getting as low as 148 and was treated with injections at that time.  He is not on B12 at present-we will update levels and see if we need to resume injections  Recommended follow up: Return in about 1 year (around 03/18/2021) for physical or sooner if needed.  Lab/Order associations: fasting   ICD-10-CM   1. Preventative health care  Z00.00 CBC with Differential/Platelet    Comprehensive metabolic panel    Lipid panel    Hemoglobin A1c    Vitamin B12  2. Essential hypertension  I10 CBC with Differential/Platelet    Comprehensive metabolic panel    Lipid panel  3. Hyperglycemia  R73.9 Hemoglobin A1c  4. Hyperlipidemia, unspecified hyperlipidemia type  E78.5 CBC with Differential/Platelet    Comprehensive metabolic panel    Lipid panel  5. BPH associated with nocturia  N40.1    R35.1   6. Ankylosing spondylitis of multiple sites in spine (  HCC)  M45.0   7. B12 deficiency  E53.8 Vitamin B12    No orders of the defined types were placed in this encounter.   Return precautions advised.  Tana Conch, MD

## 2020-03-20 ENCOUNTER — Encounter: Payer: Self-pay | Admitting: Family Medicine

## 2020-04-03 ENCOUNTER — Ambulatory Visit (INDEPENDENT_AMBULATORY_CARE_PROVIDER_SITE_OTHER): Payer: Medicare Other | Admitting: *Deleted

## 2020-04-03 ENCOUNTER — Other Ambulatory Visit: Payer: Self-pay

## 2020-04-03 DIAGNOSIS — E538 Deficiency of other specified B group vitamins: Secondary | ICD-10-CM

## 2020-04-03 MED ORDER — CYANOCOBALAMIN 1000 MCG/ML IJ SOLN
1000.0000 ug | Freq: Once | INTRAMUSCULAR | Status: AC
  Administered 2020-04-03: 1000 ug via INTRAMUSCULAR

## 2020-04-03 NOTE — Progress Notes (Signed)
Patient presents for B12 injection today. Patient received her B12 injection in left Deltoid. Patient tolerated injection well.  Documentation entered in MAR in EpicCare.   

## 2020-04-10 ENCOUNTER — Other Ambulatory Visit: Payer: Self-pay

## 2020-04-10 ENCOUNTER — Ambulatory Visit (INDEPENDENT_AMBULATORY_CARE_PROVIDER_SITE_OTHER): Payer: Medicare Other

## 2020-04-10 DIAGNOSIS — E538 Deficiency of other specified B group vitamins: Secondary | ICD-10-CM

## 2020-04-10 MED ORDER — CYANOCOBALAMIN 1000 MCG/ML IJ SOLN
1000.0000 ug | Freq: Once | INTRAMUSCULAR | Status: AC
  Administered 2020-04-10: 1000 ug via INTRAMUSCULAR

## 2020-04-10 NOTE — Progress Notes (Signed)
Jason [name] 84 y.o. male presents to office today for weekly B12 injection per Tana Conch, MD. Administered CYANOCOBALAMIN 1,000 mcg IM left arm. Patient tolerated well. Aware to return next week for injection.

## 2020-04-17 ENCOUNTER — Ambulatory Visit: Payer: Medicare Other

## 2020-04-24 ENCOUNTER — Ambulatory Visit (INDEPENDENT_AMBULATORY_CARE_PROVIDER_SITE_OTHER): Payer: Medicare Other

## 2020-04-24 ENCOUNTER — Other Ambulatory Visit: Payer: Self-pay

## 2020-04-24 DIAGNOSIS — E538 Deficiency of other specified B group vitamins: Secondary | ICD-10-CM

## 2020-04-24 MED ORDER — CYANOCOBALAMIN 1000 MCG/ML IJ SOLN
1000.0000 ug | Freq: Once | INTRAMUSCULAR | Status: AC
  Administered 2020-04-24: 1000 ug via INTRAMUSCULAR

## 2020-04-24 NOTE — Progress Notes (Signed)
Per orders of Dr. Durene Cal, injection of B12 injection given by Gracy Racer in left deltoid. Patient tolerated injection well.

## 2020-05-01 ENCOUNTER — Ambulatory Visit (INDEPENDENT_AMBULATORY_CARE_PROVIDER_SITE_OTHER): Payer: Medicare Other

## 2020-05-01 ENCOUNTER — Other Ambulatory Visit: Payer: Self-pay

## 2020-05-01 DIAGNOSIS — E538 Deficiency of other specified B group vitamins: Secondary | ICD-10-CM

## 2020-05-01 MED ORDER — CYANOCOBALAMIN 1000 MCG/ML IJ SOLN
1000.0000 ug | Freq: Once | INTRAMUSCULAR | Status: AC
  Administered 2020-05-01: 1000 ug via INTRAMUSCULAR

## 2020-05-15 NOTE — Patient Instructions (Signed)
Health Maintenance Due  Topic Date Due  . INFLUENZA VACCINE  04/21/2020    Depression screen [facility] 2/9 03/18/2020 09/11/2019 07/11/2019  Decreased Interest 0 0 0  Down, Depressed, Hopeless 0 0 0  PHQ - 2 Score 0 0 0    Recommended follow up: No follow-ups on file.

## 2020-05-15 NOTE — Progress Notes (Signed)
I have reviewed and agree with note, evaluation, plan.   Serafina Topham, MD  

## 2020-06-26 ENCOUNTER — Other Ambulatory Visit: Payer: Self-pay

## 2020-06-26 ENCOUNTER — Encounter: Payer: Self-pay | Admitting: Family Medicine

## 2020-06-26 ENCOUNTER — Ambulatory Visit (INDEPENDENT_AMBULATORY_CARE_PROVIDER_SITE_OTHER): Payer: Medicare Other

## 2020-06-26 DIAGNOSIS — Z23 Encounter for immunization: Secondary | ICD-10-CM | POA: Diagnosis not present

## 2020-08-12 ENCOUNTER — Telehealth: Payer: Self-pay | Admitting: Family Medicine

## 2020-08-12 NOTE — Telephone Encounter (Signed)
Left message for patient to call back and schedule Medicare Annual Wellness Visit (AWV) either virtually OR in office.   Last AWV 07/11/19; please schedule at anytime with [facility]-Nurse Health Advisor at [facility].  This should be a 45 minute visit.   

## 2020-11-19 DIAGNOSIS — H25813 Combined forms of age-related cataract, bilateral: Secondary | ICD-10-CM | POA: Diagnosis not present

## 2020-11-26 DIAGNOSIS — Z79899 Other long term (current) drug therapy: Secondary | ICD-10-CM | POA: Diagnosis not present

## 2020-11-26 DIAGNOSIS — H25811 Combined forms of age-related cataract, right eye: Secondary | ICD-10-CM | POA: Diagnosis not present

## 2020-11-26 DIAGNOSIS — H9190 Unspecified hearing loss, unspecified ear: Secondary | ICD-10-CM | POA: Diagnosis not present

## 2020-11-26 DIAGNOSIS — H259 Unspecified age-related cataract: Secondary | ICD-10-CM | POA: Diagnosis not present

## 2020-11-26 DIAGNOSIS — I10 Essential (primary) hypertension: Secondary | ICD-10-CM | POA: Diagnosis not present

## 2020-12-31 DIAGNOSIS — H259 Unspecified age-related cataract: Secondary | ICD-10-CM | POA: Diagnosis not present

## 2020-12-31 DIAGNOSIS — H25812 Combined forms of age-related cataract, left eye: Secondary | ICD-10-CM | POA: Diagnosis not present

## 2020-12-31 DIAGNOSIS — I110 Hypertensive heart disease with heart failure: Secondary | ICD-10-CM | POA: Diagnosis not present

## 2021-02-03 ENCOUNTER — Ambulatory Visit (INDEPENDENT_AMBULATORY_CARE_PROVIDER_SITE_OTHER): Payer: Medicare Other

## 2021-02-03 ENCOUNTER — Other Ambulatory Visit: Payer: Self-pay

## 2021-02-03 VITALS — BP 140/76 | HR 84 | Temp 98.4°F | Wt 204.0 lb

## 2021-02-03 DIAGNOSIS — Z0000 Encounter for general adult medical examination without abnormal findings: Secondary | ICD-10-CM | POA: Diagnosis not present

## 2021-02-03 NOTE — Progress Notes (Addendum)
Subjective:   Jason [name] is a 85 y.o. male who presents for Medicare Annual/Subsequent preventive examination.  Review of Systems     Cardiac Risk Factors include: advanced age (>8255men, 65>65 women);hypertension;dyslipidemia;male gender     Objective:    Today's Vitals   02/03/21 1336 02/03/21 1354  BP: (!) 174/76 140/76  Pulse: 84   Temp: 98.4 F (36.9 C)   SpO2: 96%   Weight: 204 lb (92.5 kg)    Body mass index is 28.45 kg/m.  Advanced Directives 02/03/2021 07/11/2019  Does Patient Have a Medical Advance Directive? Yes Yes  Type of Advance Directive Living will Living will;Healthcare Power of Attorney  Does patient want to make changes to medical advance directive? - No - Patient declined  Copy of Healthcare Power of Attorney in Chart? No - copy requested No - copy requested    Current Medications (verified) Outpatient Encounter Medications as of 02/03/2021  Medication Sig  . amLODipine (NORVASC) 5 MG tablet TAKE 1 TABLET(5 MG) BY MOUTH DAILY  . loratadine (CLARITIN) 10 MG tablet Take 10 mg by mouth daily.   No facility-administered encounter medications on file as of 02/03/2021.    Allergies (verified) Patient has no known allergies.   History: Past Medical History:  Diagnosis Date  . Ankylosing spondylitis (HCC)   . Diverticulosis of colon (without mention of hemorrhage)   . Hypertension   . Rheumatoid arthritis(714.0)   . Unspecified hearing loss    Past Surgical History:  Procedure Laterality Date  . ADENOIDECTOMY    . EYE SURGERY     cataract both eye   . INGUINAL HERNIA REPAIR     x3   . OTHER SURGICAL HISTORY  10/2018   urolift  . REVISION TOTAL HIP ARTHROPLASTY  1978/1992   3 hip replacements  . TONSILLECTOMY     Family History  Problem Relation Age of Onset  . Heart disease Mother   . Diabetes Mother   . Diabetes Father   . Prostate cancer Brother        x2  . Diabetes Sister        and migraines   Social History    Socioeconomic History  . Marital status: Married    Spouse name: Not on file  . Number of children: Not on file  . Years of education: Not on file  . Highest education level: Not on file  Occupational History  . Occupation: retired  Tobacco Use  . Smoking status: Never Smoker  . Smokeless tobacco: Never Used  Substance and Sexual Activity  . Alcohol use: Not Currently    Alcohol/week: 0.0 standard drinks    Comment: rare beer  . Drug use: No  . Sexual activity: Not on file  Other Topics Concern  . Not on file  Social History Narrative   Married (wife pt outside practice). 1 son. 3 grandkids.       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 Difficulty of Paying Living Expenses: Not hard at all  Food Insecurity: No Food Insecurity  . Worried About Programme researcher, broadcasting/film/videounning Out of Food in the Last Year: Never true  . Ran Out of Food in the Last Year: Never true  Transportation Needs: No Transportation Needs  . Lack of Transportation (Medical): No  . Lack  of Transportation (Non-Medical): No  Physical Activity: Sufficiently Active  . Days of Exercise per Week: 7 days  . Minutes of Exercise per Session: 30 min  Stress: No Stress Concern Present  . Feeling of Stress : Only a little  Social Connections: Socially Integrated  . Frequency of Communication with Friends and Family: More than three times a week  . Frequency of Social Gatherings with Friends and Family: Once a week  . Attends Religious Services: 1 to 4 times per year  . Active Member of Clubs or Organizations: Yes  . Attends Banker Meetings: 1 to 4 times per year  . Marital Status: Married    Tobacco Counseling Counseling given: Not Answered   Clinical Intake:  Pre-visit preparation completed: Yes  Pain : No/denies pain     BMI  - recorded: 28.45 Nutritional Status: BMI 25 -29 Overweight Nutritional Risks: None Diabetes: No  How often do you need to have someone help you when you read instructions, pamphlets, or other written materials from your doctor or pharmacy?: 1 - Never  Diabetic?No  Interpreter Needed?: No  Information entered by :: Lanier Ensign, LPN   Activities of Daily Living In your present state of health, do you have any difficulty performing the following activities: 02/03/2021  Hearing? Y  Comment wears hearing  Vision? N  Difficulty concentrating or making decisions? N  Walking or climbing stairs? N  Dressing or bathing? N  Doing errands, shopping? N  Preparing Food and eating ? N  Using the Toilet? N  In the past six months, have you accidently leaked urine? N  Do you have problems with loss of bowel control? N  Managing your Medications? N  Managing your Finances? N  Housekeeping or managing your Housekeeping? N  Some recent data might be hidden    Patient Care Team: Shelva Majestic, MD as PCP - General (Family Medicine) Durene Romans, MD as Consulting Physician (Orthopedic Surgery) Heloise Purpura, MD as Consulting Physician (Urology) [facility] [place], [place]  as Consulting Physician (Ophthalmology)  Indicate any recent Medical Services you may have received from other than [facility] providers in the past year (date may be approximate).     Assessment:   This is a routine wellness examination for Jason [name].  Hearing/Vision screen  Hearing Screening   125Hz  250Hz  500Hz  1000Hz  2000Hz  3000Hz  4000Hz  6000Hz  8000Hz   Right ear:           Left ear:           Comments: Pt wears hearing aids   Vision Screening Comments: Pt follows up with Dr in [place] for annual eye exams and cataracts   Dietary issues and exercise activities discussed: Current Exercise Habits: Home exercise routine, Type of exercise: Other - see comments (stationary bike), Time (Minutes): 30, Frequency  (Times/Week): 7, Weekly Exercise (Minutes/Week): 210  Goals Addressed            This Visit's Progress   . Patient Stated       Lose weight       Depression Screen PHQ 2/9 Scores 02/03/2021 03/18/2020 09/11/2019 07/11/2019 03/13/2019 03/11/2018 02/04/2017  PHQ - 2 Score 1 0 0 0 0 0 0    Fall Risk Fall Risk  02/03/2021 07/11/2019 03/13/2019 03/11/2018 02/04/2017  Falls in the past year? 0 0 0 No No  Number falls in past yr: 0 - 0 - -  Injury with Fall? 0 0 0 - -  Risk for fall due to :  Impaired vision Impaired mobility - - -  Follow up Falls prevention discussed Falls evaluation completed;Education provided;Falls prevention discussed Falls evaluation completed - -    FALL RISK PREVENTION PERTAINING TO THE HOME:  Any stairs in or around the home? Yes  If so, are there any without handrails? No  Home free of loose throw rugs in walkways, pet beds, electrical cords, etc? Yes  Adequate lighting in your home to reduce risk of falls? Yes   ASSISTIVE DEVICES UTILIZED TO PREVENT FALLS:  Life alert? No  Use of a cane, walker or w/c? No  Grab bars in the bathroom? No  Shower chair or bench in shower? Yes  Elevated toilet seat or a handicapped toilet? No   TIMED UP AND GO:  Was the test performed? Yes .  Length of time to ambulate 10 feet: 10 sec.   Gait steady and fast without use of assistive device  Cognitive Function:     6CIT Screen 02/03/2021 07/11/2019  What Year? 0 points 0 points  What month? 0 points 0 points  What time? - 0 points  Count back from 20 0 points 0 points  Months in reverse 0 points 0 points  Repeat phrase 0 points 0 points  Total Score - 0    Immunizations Immunization History  Administered Date(s) Administered  . Fluad Quad(high Dose 65+) 06/15/2019, 06/26/2020  . Influenza Split 09/27/2012  . Influenza Whole 06/06/2008, 07/03/2010  . Influenza, High Dose Seasonal PF 06/07/2018  . Influenza,inj,Quad PF,6+ Mos 06/26/2014, 07/04/2015  .  Influenza-Unspecified 08/05/2016  . Moderna Sars-Covid-2 Vaccination 10/11/2019, 11/03/2019  . Pneumococcal Conjugate-13 11/26/2014  . Pneumococcal Polysaccharide-23 08/28/2004  . Td 08/22/1999, 07/03/2010  . Zoster 06/06/2008  . Zoster Recombinat (Shingrix) 02/05/2017, 05/10/2017    TDAP status: Due, Education has been provided regarding the importance of this vaccine. Advised may receive this vaccine at local pharmacy or Health Dept. Aware to provide a copy of the vaccination record if obtained from local pharmacy or Health Dept. Verbalized acceptance and understanding.  Flu Vaccine status: Up to date  Pneumococcal vaccine status: Up to date  Covid-19 vaccine status: Completed vaccines  Qualifies for Shingles Vaccine? Yes   Zostavax completed No   Shingrix Completed?: No.    Education has been provided regarding the importance of this vaccine. Patient has been advised to call insurance company to determine out of pocket expense if they have not yet received this vaccine. Advised may also receive vaccine at local pharmacy or Health Dept. Verbalized acceptance and understanding.  Screening Tests Health Maintenance  Topic Date Due  . COVID-19 Vaccine (3 - Booster) 04/01/2020  . TETANUS/TDAP  07/03/2020  . INFLUENZA VACCINE  04/21/2021  . PNA vac Low Risk Adult  Completed  . HPV VACCINES  Aged Out    Health Maintenance  Health Maintenance Due  Topic Date Due  . COVID-19 Vaccine (3 - Booster) 04/01/2020  . TETANUS/TDAP  07/03/2020    Colorectal cancer screening: No longer required.    Additional Screening:  Vision Screening: Recommended annual ophthalmology exams for early detection of glaucoma and other disorders of the eye. Is the patient up to date with their annual eye exam?  Yes  Who is the provider or what is the name of the office in which the patient attends annual eye exams? Dr Caroll Rancher If pt is not established with a provider, would they like to be referred to a  provider to establish care? No .   Dental Screening: Recommended  annual dental exams for proper oral hygiene  Community Resource Referral / Chronic Care Management: CRR required this visit?  No   CCM required this visit?  No      Plan:     I have personally reviewed and noted the following in the patient's chart:   . Medical and social history . Use of alcohol, tobacco or illicit drugs  . Current medications and supplements including opioid prescriptions. Patient is not currently taking opioid prescriptions. . Functional ability and status . Nutritional status . Physical activity . Advanced directives . List of other physicians . Hospitalizations, surgeries, and ER visits in previous 12 months . Vitals . Screenings to include cognitive, depression, and falls . Referrals and appointments  In addition, I have reviewed and discussed with patient certain preventive protocols, quality metrics, and best practice recommendations. A written personalized care plan for preventive services as well as general preventive health recommendations were provided to patient.     Marzella Schlein, LPN   12/22/4740   Nurse Notes: None

## 2021-02-03 NOTE — Patient Instructions (Addendum)
Jason [name] , Thank you for taking time to come for your Medicare Wellness Visit. I appreciate your ongoing commitment to your health goals. Please review the following plan we discussed and let me know if I can assist you in the future.   Screening recommendations/referrals: Colonoscopy: Done 03/03/17 Recommended yearly ophthalmology/optometry visit for glaucoma screening and checkup Recommended yearly dental visit for hygiene and checkup  Vaccinations: Influenza vaccine: Up to date Pneumococcal vaccine: Up to date Tdap vaccine: Due and discussed Shingles vaccine: Completed 5/18 & 05/11/19   Covid-19: Completed 1/20 & 11/03/19  Advanced directives: Please bring a copy of your health care power of attorney and living will to the office at your convenience.  Conditions/risks identified: Lose weight   Next appointment: Follow up in one year for your annual wellness visit.   Preventive Care 83 Years and Older, Male Preventive care refers to lifestyle choices and visits with your health care provider that can promote health and wellness. What does preventive care include?  A yearly physical exam. This is also called an annual well check.  Dental exams once or twice a year.  Routine eye exams. Ask your health care provider how often you should have your eyes checked.  Personal lifestyle choices, including:  Daily care of your teeth and gums.  Regular physical activity.  Eating a healthy diet.  Avoiding tobacco and drug use.  Limiting alcohol use.  Practicing safe sex.  Taking low doses of aspirin every day.  Taking vitamin and mineral supplements as recommended by your health care provider. What happens during an annual well check? The services and screenings done by your health care provider during your annual well check will depend on your age, overall health, lifestyle risk factors, and family history of disease. Counseling  Your health care provider may ask you  questions about your:  Alcohol use.  Tobacco use.  Drug use.  Emotional well-being.  Home and relationship well-being.  Sexual activity.  Eating habits.  History of falls.  Memory and ability to understand (cognition).  Work and work Astronomer. Screening  You may have the following tests or measurements:  Height, weight, and BMI.  Blood pressure.  Lipid and cholesterol levels. These may be checked every 5 years, or more frequently if you are over 35 years old.  Skin check.  Lung cancer screening. You may have this screening every year starting at age 83 if you have a 30-pack-year history of smoking and currently smoke or have quit within the past 15 years.  Fecal occult blood test (FOBT) of the stool. You may have this test every year starting at age 83.  Flexible sigmoidoscopy or colonoscopy. You may have a sigmoidoscopy every 5 years or a colonoscopy every 10 years starting at age 83.  Prostate cancer screening. Recommendations will vary depending on your family history and other risks.  Hepatitis C blood test.  Hepatitis B blood test.  Sexually transmitted disease (STD) testing.  Diabetes screening. This is done by checking your blood sugar (glucose) after you have not eaten for a while (fasting). You may have this done every 1-3 years.  Abdominal aortic aneurysm (AAA) screening. You may need this if you are a current or former smoker.  Osteoporosis. You may be screened starting at age 83 if you are at high risk. Talk with your health care provider about your test results, treatment options, and if necessary, the need for more tests. Vaccines  Your health care provider may recommend certain vaccines,  such as:  Influenza vaccine. This is recommended every year.  Tetanus, diphtheria, and acellular pertussis (Tdap, Td) vaccine. You may need a Td booster every 10 years.  Zoster vaccine. You may need this after age 83.  Pneumococcal 13-valent conjugate  (PCV13) vaccine. One dose is recommended after age 83.  Pneumococcal polysaccharide (PPSV23) vaccine. One dose is recommended after age 83. Talk to your health care provider about which screenings and vaccines you need and how often you need them. This information is not intended to replace advice given to you by your health care provider. Make sure you discuss any questions you have with your health care provider. Document Released: 10/04/2015 Document Revised: 05/27/2016 Document Reviewed: 07/09/2015 Elsevier Interactive Patient Education  2017 [place] Prevention in the Home Falls can cause injuries. They can happen to people of all ages. There are many things you can do to make your home safe and to help prevent falls. What can I do on the outside of my home?  Regularly fix the edges of walkways and driveways and fix any cracks.  Remove anything that might make you trip as you walk through a door, such as a raised step or threshold.  Trim any bushes or trees on the path to your home.  Use bright outdoor lighting.  Clear any walking paths of anything that might make someone trip, such as rocks or tools.  Regularly check to see if handrails are loose or broken. Make sure that both sides of any steps have handrails.  Any raised decks and porches should have guardrails on the edges.  Have any leaves, snow, or ice cleared regularly.  Use sand or salt on walking paths during winter.  Clean up any spills in your garage right away. This includes oil or grease spills. What can I do in the bathroom?  Use night lights.  Install grab bars by the toilet and in the tub and shower. Do not use towel bars as grab bars.  Use non-skid mats or decals in the tub or shower.  If you need to sit down in the shower, use a plastic, non-slip stool.  Keep the floor dry. Clean up any water that spills on the floor as soon as it happens.  Remove soap buildup in the tub or shower  regularly.  Attach bath mats securely with double-sided non-slip rug tape.  Do not have throw rugs and other things on the floor that can make you trip. What can I do in the bedroom?  Use night lights.  Make sure that you have a light by your bed that is easy to reach.  Do not use any sheets or blankets that are too big for your bed. They should not hang down onto the floor.  Have a firm chair that has side arms. You can use this for support while you get dressed.  Do not have throw rugs and other things on the floor that can make you trip. What can I do in the kitchen?  Clean up any spills right away.  Avoid walking on wet floors.  Keep items that you use a lot in easy-to-reach places.  If you need to reach something above you, use a strong step stool that has a grab bar.  Keep electrical cords out of the way.  Do not use floor polish or wax that makes floors slippery. If you must use wax, use non-skid floor wax.  Do not have throw rugs and other things on  the floor that can make you trip. What can I do with my stairs?  Do not leave any items on the stairs.  Make sure that there are handrails on both sides of the stairs and use them. Fix handrails that are broken or loose. Make sure that handrails are as long as the stairways.  Check any carpeting to make sure that it is firmly attached to the stairs. Fix any carpet that is loose or worn.  Avoid having throw rugs at the top or bottom of the stairs. If you do have throw rugs, attach them to the floor with carpet tape.  Make sure that you have a light switch at the top of the stairs and the bottom of the stairs. If you do not have them, ask someone to add them for you. What else can I do to help prevent falls?  Wear shoes that:  Do not have high heels.  Have rubber bottoms.  Are comfortable and fit you well.  Are closed at the toe. Do not wear sandals.  If you use a stepladder:  Make sure that it is fully  opened. Do not climb a closed stepladder.  Make sure that both sides of the stepladder are locked into place.  Ask someone to hold it for you, if possible.  Clearly mark and make sure that you can see:  Any grab bars or handrails.  First and last steps.  Where the edge of each step is.  Use tools that help you move around (mobility aids) if they are needed. These include:  Canes.  Walkers.  Scooters.  Crutches.  Turn on the lights when you go into a dark area. Replace any light bulbs as soon as they burn out.  Set up your furniture so you have a clear path. Avoid moving your furniture around.  If any of your floors are uneven, fix them.  If there are any pets around you, be aware of where they are.  Review your medicines with your doctor. Some medicines can make you feel dizzy. This can increase your chance of falling. Ask your doctor what other things that you can do to help prevent falls. This information is not intended to replace advice given to you by your health care provider. Make sure you discuss any questions you have with your health care provider. Document Released: 07/04/2009 Document Revised: 02/13/2016 Document Reviewed: 10/12/2014 Elsevier Interactive Patient Education  2017 [employer].

## 2021-03-20 ENCOUNTER — Encounter: Payer: Self-pay | Admitting: Family Medicine

## 2021-03-20 ENCOUNTER — Other Ambulatory Visit: Payer: Self-pay

## 2021-03-20 ENCOUNTER — Ambulatory Visit (INDEPENDENT_AMBULATORY_CARE_PROVIDER_SITE_OTHER): Payer: Medicare Other | Admitting: Family Medicine

## 2021-03-20 VITALS — BP 156/79 | HR 78 | Temp 98.2°F | Ht 71.0 in | Wt 202.8 lb

## 2021-03-20 DIAGNOSIS — E538 Deficiency of other specified B group vitamins: Secondary | ICD-10-CM

## 2021-03-20 DIAGNOSIS — R739 Hyperglycemia, unspecified: Secondary | ICD-10-CM | POA: Diagnosis not present

## 2021-03-20 DIAGNOSIS — R3915 Urgency of urination: Secondary | ICD-10-CM | POA: Diagnosis not present

## 2021-03-20 DIAGNOSIS — Z0000 Encounter for general adult medical examination without abnormal findings: Secondary | ICD-10-CM | POA: Diagnosis not present

## 2021-03-20 DIAGNOSIS — I10 Essential (primary) hypertension: Secondary | ICD-10-CM

## 2021-03-20 DIAGNOSIS — E785 Hyperlipidemia, unspecified: Secondary | ICD-10-CM

## 2021-03-20 DIAGNOSIS — M450 Ankylosing spondylitis of multiple sites in spine: Secondary | ICD-10-CM

## 2021-03-20 LAB — POC URINALSYSI DIPSTICK (AUTOMATED)
Bilirubin, UA: NEGATIVE
Blood, UA: NEGATIVE
Glucose, UA: NEGATIVE
Ketones, UA: NEGATIVE
Leukocytes, UA: NEGATIVE
Nitrite, UA: NEGATIVE
Protein, UA: NEGATIVE
Spec Grav, UA: 1.015 (ref 1.010–1.025)
Urobilinogen, UA: 1 E.U./dL
pH, UA: 6.5 (ref 5.0–8.0)

## 2021-03-20 LAB — CBC WITH DIFFERENTIAL/PLATELET
Basophils Absolute: 0.1 10*3/uL (ref 0.0–0.1)
Basophils Relative: 0.9 % (ref 0.0–3.0)
Eosinophils Absolute: 0.2 10*3/uL (ref 0.0–0.7)
Eosinophils Relative: 3 % (ref 0.0–5.0)
HCT: 39.4 % (ref 39.0–52.0)
Hemoglobin: 13.3 g/dL (ref 13.0–17.0)
Lymphocytes Relative: 27.5 % (ref 12.0–46.0)
Lymphs Abs: 2.1 10*3/uL (ref 0.7–4.0)
MCHC: 33.7 g/dL (ref 30.0–36.0)
MCV: 91.8 fl (ref 78.0–100.0)
Monocytes Absolute: 0.5 10*3/uL (ref 0.1–1.0)
Monocytes Relative: 7.1 % (ref 3.0–12.0)
Neutro Abs: 4.7 10*3/uL (ref 1.4–7.7)
Neutrophils Relative %: 61.5 % (ref 43.0–77.0)
Platelets: 269 10*3/uL (ref 150.0–400.0)
RBC: 4.29 Mil/uL (ref 4.22–5.81)
RDW: 13.2 % (ref 11.5–15.5)
WBC: 7.6 10*3/uL (ref 4.0–10.5)

## 2021-03-20 LAB — LIPID PANEL
Cholesterol: 176 mg/dL (ref 0–200)
HDL: 36.6 mg/dL — ABNORMAL LOW (ref >39.00)
LDL Cholesterol: 108 mg/dL — ABNORMAL HIGH (ref 0–99)
NonHDL: 139.65
Total CHOL/HDL Ratio: 5
Triglycerides: 159 mg/dL — ABNORMAL HIGH (ref 0.0–149.0)
VLDL: 31.8 mg/dL (ref 0.0–40.0)

## 2021-03-20 LAB — COMPREHENSIVE METABOLIC PANEL
ALT: 16 U/L (ref 0–53)
AST: 19 U/L (ref 0–37)
Albumin: 4.3 g/dL (ref 3.5–5.2)
Alkaline Phosphatase: 79 U/L (ref 39–117)
BUN: 17 mg/dL (ref 6–23)
CO2: 28 mEq/L (ref 19–32)
Calcium: 9.4 mg/dL (ref 8.4–10.5)
Chloride: 101 mEq/L (ref 96–112)
Creatinine, Ser: 1.15 mg/dL (ref 0.40–1.50)
GFR: 59.04 mL/min — ABNORMAL LOW (ref >60.00)
Glucose, Bld: 102 mg/dL — ABNORMAL HIGH (ref 70–99)
Potassium: 4.4 mEq/L (ref 3.5–5.1)
Sodium: 138 mEq/L (ref 135–145)
Total Bilirubin: 0.6 mg/dL (ref 0.2–1.2)
Total Protein: 7.8 g/dL (ref 6.0–8.3)

## 2021-03-20 LAB — HEMOGLOBIN A1C: Hgb A1c MFr Bld: 6.3 % (ref 4.6–6.5)

## 2021-03-20 LAB — VITAMIN B12: Vitamin B-12: 363 pg/mL (ref 211–911)

## 2021-03-20 NOTE — Addendum Note (Signed)
Addended by: Lorn Junes on: 03/20/2021 02:18 PM   Modules accepted: Orders

## 2021-03-20 NOTE — Patient Instructions (Addendum)
Health Maintenance Due  Topic Date Due   TETANUS/TDAP Please check with your local pharmacy.  07/03/2020   COVID-19 Vaccine (4 - Booster) Please get this scheduled in the fall.  01/05/2021   Your blood pressure trend concerns me. I would like for you to buy/use a home cuff to check at least 4x a week. Your goal is <135/85.  Bring your home cuff and your log of blood pressures with you to next visit. Update me in 2-3 weeks by mychart with your home readings.   Please stop by lab before you go If you have mychart- we will send your results within 3 business days of [place] receiving them.  If you do not have mychart- we will call you about results within 5 business days of [place] receiving them.  *please also note that you will see labs on mychart as soon as they post. I will later go in and write notes on them- will say "notes from Dr. Durene Cal"   Recommended follow up: Return in about 6 months (around 09/19/2021) for follow up- or sooner if needed.

## 2021-03-20 NOTE — Progress Notes (Signed)
Phone: [phone]   Subjective:  Patient presents today for their annual physical. Chief complaint-noted.   See problem oriented charting- ROS- full  review of systems was completed and negative  except for: increased urination, urinary frequency, urinary urgency  The following were reviewed and entered/updated in epic: Past Medical History:  Diagnosis Date   Ankylosing spondylitis (HCC)    Diverticulosis of colon (without mention of hemorrhage)    Hypertension    Rheumatoid arthritis(714.0)    Unspecified hearing loss    Patient Active Problem List   Diagnosis Date Noted   Hyperlipidemia 02/04/2017    Priority: Medium   BPH associated with nocturia 11/26/2014    Priority: Medium   Hyperglycemia 11/26/2014    Priority: Medium   Essential hypertension 12/30/2009    Priority: Medium   Bowel incontinence 07/04/2015    Priority: Low   Hearing loss 06/06/2008    Priority: Low   Ankylosing spondylitis (HCC) 10/25/2007    Priority: Low   B12 deficiency 03/18/2020   Paresthesias 05/06/2018   Past Surgical History:  Procedure Laterality Date   ADENOIDECTOMY     CATARACT EXTRACTION, BILATERAL  2020   EYE SURGERY     cataract both eye    INGUINAL HERNIA REPAIR     x3    OTHER SURGICAL HISTORY  10/2018   urolift   REVISION TOTAL HIP ARTHROPLASTY  1978/1992   3 hip replacements   TONSILLECTOMY      Family History  Problem Relation Age of Onset   Heart disease Mother    Diabetes Mother    Diabetes Father    Prostate cancer Brother        x2   Diabetes Sister        and migraines    Medications- reviewed and updated Current Outpatient Medications  Medication Sig Dispense Refill   amLODipine (NORVASC) 5 MG tablet TAKE 1 TABLET(5 MG) BY MOUTH DAILY 90 tablet 3   amoxicillin (AMOXIL) 500 MG capsule Take 500 mg by mouth as needed. Take 4 capsules 1 hour before dental appointments.     docusate sodium (COLACE) 50 MG capsule Take 50 mg by mouth daily.     OVER THE  COUNTER MEDICATION Take 4 mg by mouth daily. Takes 2 capsules of CHLOR TABS daily.     OVER THE COUNTER MEDICATION Take 8.6 mg by mouth daily. Take 1 Tablet of SENNESIDES daily.     No current facility-administered medications for this visit.    Allergies-reviewed and updated No Known Allergies  Social History   Social History Narrative   Married (wife pt outside practice). 1 son. 3 grandkids.       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 Oropharynx normal Neck: no thyromegaly CV: RRR no murmurs rubs or gallops Lungs: CTAB no crackles, wheeze, rhonchi Abdomen: soft/nontender/nondistended/normal bowel sounds. No rebound or guarding.  Ext: no edema Skin: warm, dry Neuro: grossly normal, moves all extremities, PERRLA    Assessment and Plan  85 y.o. male presenting for annual physical.  Health Maintenance counseling: 1. Anticipatory  guidance: Patient counseled regarding regular dental exams -q6 months, eye exams -yearly,  avoiding smoking and second hand smoke , limiting alcohol to 2 beverages per day -does not drink.      2. Risk factor reduction:  Advised patient of need for regular exercise and diet rich and fruits and vegetables to reduce risk of heart attack and stroke. Exercise- stationary bike daily for 30 minutes per day. Diet-has tried to healthier and cut night time snacks in the past- lost 4 lbs on 03/18/20 visit.  Weight stable year-over-year this year. Wt Readings from Last 3 Encounters:  03/20/21 202 lb 12.8 oz (92 kg)  02/03/21 204 lb (92.5 kg)  03/18/20 202 lb 3.2 oz (91.7 kg)  3. Immunizations/screenings/ancillary studies-  COVID-19 -2nd booster- he is waiting to see if formulation will change-  and last Tdap 07/03/2010 but he is due- recommended at pharmacy Immunization History  Administered Date(s) Administered   Fluad Quad(high Dose 65+) 06/15/2019, 06/26/2020   Influenza Split 09/27/2012   Influenza Whole 06/06/2008, 07/03/2010   Influenza, High Dose Seasonal PF 06/07/2018   Influenza,inj,Quad PF,6+ Mos 06/26/2014, 07/04/2015   Influenza-Unspecified 08/05/2016   Moderna SARS-COV2 Booster Vaccination 09/06/2020   Moderna Sars-Covid-2 Vaccination 10/11/2019, 11/03/2019   Pneumococcal Conjugate-13 11/26/2014   Pneumococcal Polysaccharide-23 08/28/2004   Td 08/22/1999, 07/03/2010   Zoster Recombinat (Shingrix) 02/05/2017, 05/10/2017   Zoster, Live 06/06/2008   4. Prostate cancer screening- past age based screening- he reported he did have this done prior with urology- see BPH discussion below  Lab Results  Component Value Date   PSA 2.77 01/24/2016   PSA 2.33 11/26/2014   PSA 2.47 11/15/2013   5. Colon cancer screening - [facility] said no more colonoscopy for screening purposes. No blood in stool.  6. Skin cancer screening- does not see dermatology. advised regular sunscreen use. Denies worrisome, changing, or new skin lesions.  7. never smoker 8. STD screening - opts out as monogamous  Status of chronic or acute concerns   #social update- patients wife has not been doing well- took some time late last year to determine she was having seizure activity and that has been a big burden on him- affected his lifestyle. She is improving some. He is still having to do some things he is not used ot including doing a lot more in the kitchen  # Hyperglycemia/insulin resistance/prediabetes S:medications: none Exercise and diet- biking daily, trying to eat healthier  Lab Results  Component Value Date   HGBA1C 6.2 03/18/2020   HGBA1C 6.1 09/11/2019   HGBA1C 6.2 03/13/2019  A/P: hopefully stable- update a1c  today. Continue without meds for now   #hypertension S: medication: Amlodipine 5 mg Home readings #s: has cuff but has not recently checked BP Readings from Last 3 Encounters:  03/20/21 (!) 156/79  02/03/21 140/76  03/18/20 124/60  A/P: mild poor control today- advised home monitoring - update me within 2 weeks with readings  #hyperlipidemia S: Medication: none Lab Results  Component Value Date   CHOL 184 03/18/2020   HDL 33.10 (L) 03/18/2020   LDLCALC 119 (H) 03/18/2020   LDLDIRECT 127.0 11/26/2014   TRIG 161.0 (H) 03/18/2020   CHOLHDL 6 03/18/2020   A/P: Past age based indications for starting statin-lipids are mildly elevated and would suggest working on lifestyle  #BPH associated with nocturia- saw urology but nothing in particular seemed to help- multiple medication trials. He is no longer on oxybutynin. He just learned tolerate this/live with it. -cytoscopy or urolift  was not helpful - he states urgency has worsened in last 2-3 months. If seated fora period then gets up- urgency is rather severe.  -we will get ua and culture today- he will follow up with urology once things settle down with wife  #Ankylosing spondylitis of multiple sites in the spine [facility])- intermittent back pain if really works hard. Had some pain down 1 leg that was thought to be sciatic nerve- prior surgeries deemed stable -Currently stable on no medication.  #Low B-12- history in 2019 of B-12 getting as low as148 and was treated with injections at that time.  He ran out of b12 and has not restarted in last 6 weeks - if low may need injections for a month then could try orals again potentially Lab Results  Component Value Date   VITAMINB12 172 (L) 03/18/2020    Recommended follow up: No follow-ups on file. Future Appointments  Date Time Provider Department Center  02/09/2022  1:45 PM [facility]   Lab/Order associations: fasting   ICD-10-CM   1. Preventative health care   Z00.00     2. Hyperlipidemia, unspecified hyperlipidemia type  E78.5     3. Essential hypertension  I10     4. Hyperglycemia  R73.9     5. Ankylosing spondylitis of multiple sites in spine (HCC)  M45.0     6. B12 deficiency  E53.8     7. Urinary urgency  R39.15      I,Harris Phan,acting as a scribe for Tana Conch, MD.,have documented all relevant documentation on the behalf of Tana Conch, MD,as directed by  Tana Conch, MD while in the presence of Tana Conch, MD.  I, Tana Conch, MD, have reviewed all documentation for this visit. The documentation on 03/20/21 for the exam, diagnosis, procedures, and orders are all accurate and complete.  Return precautions advised.  Tana Conch, MD

## 2021-03-21 ENCOUNTER — Other Ambulatory Visit: Payer: Self-pay

## 2021-03-21 LAB — URINE CULTURE
MICRO NUMBER:: 12070132
Result:: NO GROWTH
SPECIMEN QUALITY:: ADEQUATE

## 2021-03-21 NOTE — Progress Notes (Signed)
Wife notified of lab results, B-12 added to RX list

## 2021-04-28 ENCOUNTER — Other Ambulatory Visit: Payer: Self-pay | Admitting: Family Medicine

## 2021-05-12 ENCOUNTER — Telehealth: Payer: Self-pay

## 2021-05-12 NOTE — Telephone Encounter (Signed)
See my note below about medication changes (below keba notes)  Home readings typically high 130s to 150s with diastolic controlled. Only 2 of 10 readings under 120 and 137 and 138 at that.  I think amlodipine twice daily or 10 mg once daily as below will help him get to goal.

## 2021-05-12 NOTE — Telephone Encounter (Signed)
See below

## 2021-05-12 NOTE — Telephone Encounter (Signed)
His blood pressure cuff and our readings on the manual reading were the same?  Just want to verify this and also want to make sure it was done manually and not just by machine in office  Please have him take amlodipine twice a day until he runs out.  Send in amlodipine 10 mg daily #90 with 3 refills and have him take this in the evening before bed.  Update me in 1 week with home blood pressures.  If he has symptoms such as chest pain, shortness of breath, worsening headache needs to seek care immediately in emergency room  I do think recent stressors are definitely playing a role

## 2021-05-12 NOTE — Telephone Encounter (Signed)
Jason [name] came in our office today to make sure his blood pressure machine was reading correctly. It was reading correctly but the  reading was very high. When he came in the office today his BP was 192/100 and he was very concerned about that. He states that he takes his BP medication In the mornings around 7:30 am and he stated that he did take his medication on time today, He wants to know what he should do in regards to his BP. I placed his home readings that he brought in the office with him today in your review folder.

## 2021-05-13 MED ORDER — AMLODIPINE BESYLATE 10 MG PO TABS: 10.0000 mg | ORAL_TABLET | Freq: Every day | ORAL | 3 refills | Status: DC

## 2021-05-13 NOTE — Telephone Encounter (Signed)
Patient is aware of the medication changes that Dr. Durene Cal made and the comments associated with the medication changes. Patient gave a verbal understanding. I have sent in his new prescription.

## 2021-05-13 NOTE — Addendum Note (Signed)
Addended by: Daryll Brod on: 05/13/2021 11:35 AM   Modules accepted: Orders

## 2021-06-12 ENCOUNTER — Encounter: Payer: Self-pay | Admitting: Family Medicine

## 2021-07-08 ENCOUNTER — Other Ambulatory Visit: Payer: Self-pay

## 2021-07-08 ENCOUNTER — Encounter: Payer: Self-pay | Admitting: Family Medicine

## 2021-07-08 ENCOUNTER — Ambulatory Visit (INDEPENDENT_AMBULATORY_CARE_PROVIDER_SITE_OTHER): Payer: Medicare Other | Admitting: Family Medicine

## 2021-07-08 VITALS — BP 138/72 | HR 72 | Temp 98.0°F | Ht 71.0 in | Wt 202.6 lb

## 2021-07-08 DIAGNOSIS — I10 Essential (primary) hypertension: Secondary | ICD-10-CM

## 2021-07-08 DIAGNOSIS — R351 Nocturia: Secondary | ICD-10-CM | POA: Diagnosis not present

## 2021-07-08 DIAGNOSIS — N401 Enlarged prostate with lower urinary tract symptoms: Secondary | ICD-10-CM

## 2021-07-08 NOTE — Progress Notes (Signed)
Phone [phone] In person visit   Subjective:   Jason [name] is a 85 y.o. year old very pleasant male patient who presents for/with See problem oriented charting Chief Complaint  Patient presents with   Hypertension   Hyperlipidemia   Hyperglycemia    This visit occurred during the SARS-CoV-2 public health emergency.  Safety protocols were in place, including screening questions prior to the visit, additional usage of staff PPE, and extensive cleaning of exam room while observing appropriate contact time as indicated for disinfecting solutions.   Past Medical History-  Patient Active Problem List   Diagnosis Date Noted   Hyperlipidemia 02/04/2017    Priority: 2.   BPH associated with nocturia 11/26/2014    Priority: 2.   Hyperglycemia 11/26/2014    Priority: 2.   Essential hypertension 12/30/2009    Priority: 2.   Bowel incontinence 07/04/2015    Priority: 3.   Hearing loss 06/06/2008    Priority: 3.   Ankylosing spondylitis (HCC) 10/25/2007    Priority: 3.   B12 deficiency 03/18/2020   Paresthesias 05/06/2018    Medications- reviewed and updated Current Outpatient Medications  Medication Sig Dispense Refill   amLODipine (NORVASC) 10 MG tablet Take 1 tablet (10 mg total) by mouth at bedtime. 90 tablet 3   amoxicillin (AMOXIL) 500 MG capsule Take 500 mg by mouth as needed. Take 4 capsules 1 hour before dental appointments.     docusate sodium (COLACE) 50 MG capsule Take 50 mg by mouth daily.     OVER THE COUNTER MEDICATION Take 4 mg by mouth daily. Takes 2 capsules of CHLOR TABS daily.     OVER THE COUNTER MEDICATION Take 8.6 mg by mouth daily. Take 1 Tablet of SENNESIDES daily.     vitamin B-12 (CYANOCOBALAMIN) 1000 MCG tablet Take 1,000 mcg by mouth daily.     No current facility-administered medications for this visit.     Objective:  BP 138/72   Pulse 72   Temp 98 F (36.7 C) (Temporal)   Ht 5\' 11"  (1.803 m)   Wt 202 lb 9.6 oz (91.9 kg)   SpO2 97%    BMI 28.26 kg/m  Gen: NAD, resting comfortably CV: RRR no murmurs rubs or gallops Lungs: CTAB no crackles, wheeze, rhonchi Abdomen: soft/nontender/nondistended/normal bowel sounds. No rebound or guarding.  Ext: Trace to 1+ edema    Assessment and Plan    #hypertension S: medication: Amlodipine 10 mg daily increased from 5 mg dose on 05/13/2021. BP has improved though - patient reported having some swelling in his feet and ankles- started elevating at night and has improved symptoms some Home readings #s:  from 124-152/66-78. Average over 135 systolic. Avg 144/71 but he states not waiting full 5 mins resting and has been holding arm down by side -has been on HCTZ in past- came off of this with some urinary symptoms from BPH/OAB/polyuria in 2019  BP Readings from Last 3 Encounters:  07/08/21 138/72  03/20/21 (!) 156/79  02/03/21 140/76  A/P: Reasonable control on repeat.  Blood pressure has been mildly elevated at home but gave patient some adjustments on how to properly take blood pressure at home.  Suspect blood pressure will be better controlled after making these adjustments.  For now we opted to continue current medication-he is tolerating edema better after elevating the legs in the evening-if symptoms worsen we could certainly reduce this dose and consider alternate such as ARB-see BPH discussion below and avoidance of HCTZ plan/reasoning  #  BPH associated with Nocturia S:Patient has seen urology in the past but nothing seemed to help - multiple medication trials. No longer on oxybutynin. He learned to tolerate/live with this-still prefers to remain off medication at present - patient planned to follow with urology once things get settled with wife A/P: Poor control-we briefly considered adding hydrochlorothiazide when initial blood pressure here was elevated and upon review of home readings.  After further evaluation and seeing blood pressure improved in office and looking back over  history in fact he did not tolerate hydrochlorothiazide before We opted to hold off on diuretic-can certainly follow-up with urology when things settle down for him   #Health maintenance-had flu shot yesterday-recently updated Tdap.  Recommended updating COVID-19 booster in 3 weeks.  Consider prevnar at later date. Does not in fact need prevnar 20 Immunization History  Administered Date(s) Administered   Fluad Quad(high Dose 65+) 06/15/2019, 06/26/2020, 07/07/2021   Influenza Split 09/27/2012   Influenza Whole 06/06/2008, 07/03/2010   Influenza, High Dose Seasonal PF 06/07/2018   Influenza,inj,Quad PF,6+ Mos 06/26/2014, 07/04/2015   Influenza-Unspecified 08/05/2016   Moderna SARS-COV2 Booster Vaccination 09/06/2020   Moderna Sars-Covid-2 Vaccination 10/11/2019, 11/03/2019   Pneumococcal Conjugate-13 11/26/2014   Pneumococcal Polysaccharide-23 08/28/2004   Td 08/22/1999, 07/03/2010   Tetanus 04/28/2021   Zoster Recombinat (Shingrix) 02/05/2017, 05/10/2017   Zoster, Live 06/06/2008    Recommended follow up: Plans to schedule physical 1 year out from last physical-offered 6 months but he declines Future Appointments  Date Time Provider Department Center  02/09/2022  1:45 PM [facility]  03/26/2022 10:40 AM Shelva Majestic, MD [facility]    Lab/Order associations:   ICD-10-CM   1. Essential hypertension  I10     2. BPH associated with nocturia  N40.1    R35.1      I,Harris Phan,acting as a scribe for Tana Conch, MD.,have documented all relevant documentation on the behalf of Tana Conch, MD,as directed by  Tana Conch, MD while in the presence of Tana Conch, MD.   I, Tana Conch, MD, have reviewed all documentation for this visit. The documentation on 07/08/21 for the exam, diagnosis, procedures, and orders are all accurate and complete.   Return precautions advised.  Tana Conch, MD

## 2021-07-08 NOTE — Patient Instructions (Addendum)
Health Maintenance Due  Topic Date Due   COVID-19 Vaccine (4 - Booster) - Recommend getting Omicron/Bivalent booster only at your local pharmacy! Please let us know when you have received this vaccination.  01/05/2021   Continue amlodipine 10 mg daily, elevate legs, and  monitor home blood pressure reading- if your blood pressures continue to be >140/90 despite waiting at least 5 minutes to check blood pressures please let me know in the next 2-4 weeks. Also, make sure that when you are checking your blood pressure that your arm is at heart level for accuracy.  Recommended follow up: Schedule physical 1 year out from last physical.  If you need a sooner or if blood pressure remains elevated at home we can certainly check back in sooner than that.

## 2022-02-09 ENCOUNTER — Ambulatory Visit (INDEPENDENT_AMBULATORY_CARE_PROVIDER_SITE_OTHER): Payer: Medicare Other

## 2022-02-09 VITALS — BP 130/70 | HR 85 | Temp 97.4°F | Wt 201.6 lb

## 2022-02-09 DIAGNOSIS — Z0000 Encounter for general adult medical examination without abnormal findings: Secondary | ICD-10-CM | POA: Diagnosis not present

## 2022-02-09 NOTE — Patient Instructions (Signed)
Jason [name] , Thank you for taking time to come for your Medicare Wellness Visit. I appreciate your ongoing commitment to your health goals. Please review the following plan we discussed and let me know if I can assist you in the future.   Screening recommendations/referrals: Colonoscopy: no longer required  Recommended yearly ophthalmology/optometry visit for glaucoma screening and checkup Recommended yearly dental visit for hygiene and checkup  Vaccinations: Influenza vaccine: Done 07/07/21 repeat every year  Pneumococcal vaccine: Up to date Tdap vaccine: Done 04/28/21 repeat every 10 years  Shingles vaccine: completed 5/18, 05/10/17    Covid-19: Completed 1/20, 2/12, 09/06/20, 07/16/21  Advanced directives: Please bring a copy of your health care power of attorney and living will to the office at your convenience.  Conditions/risks identified: Maintain weight   Next appointment: Follow up in one year for your annual wellness visit.   Preventive Care 84 Years and Older, Male Preventive care refers to lifestyle choices and visits with your health care provider that can promote health and wellness. What does preventive care include? A yearly physical exam. This is also called an annual well check. Dental exams once or twice a year. Routine eye exams. Ask your health care provider how often you should have your eyes checked. Personal lifestyle choices, including: Daily care of your teeth and gums. Regular physical activity. Eating a healthy diet. Avoiding tobacco and drug use. Limiting alcohol use. Practicing safe sex. Taking low doses of aspirin every day. Taking vitamin and mineral supplements as recommended by your health care provider. What happens during an annual well check? The services and screenings done by your health care provider during your annual well check will depend on your age, overall health, lifestyle risk factors, and family history of disease. Counseling   Your health care provider may ask you questions about your: Alcohol use. Tobacco use. Drug use. Emotional well-being. Home and relationship well-being. Sexual activity. Eating habits. History of falls. Memory and ability to understand (cognition). Work and work Statistician. Screening  You may have the following tests or measurements: Height, weight, and BMI. Blood pressure. Lipid and cholesterol levels. These may be checked every 5 years, or more frequently if you are over 27 years old. Skin check. Lung cancer screening. You may have this screening every year starting at age 17 if you have a 30-pack-year history of smoking and currently smoke or have quit within the past 15 years. Fecal occult blood test (FOBT) of the stool. You may have this test every year starting at age 104. Flexible sigmoidoscopy or colonoscopy. You may have a sigmoidoscopy every 5 years or a colonoscopy every 10 years starting at age 59. Prostate cancer screening. Recommendations will vary depending on your family history and other risks. Hepatitis C blood test. Hepatitis B blood test. Sexually transmitted disease (STD) testing. Diabetes screening. This is done by checking your blood sugar (glucose) after you have not eaten for a while (fasting). You may have this done every 1-3 years. Abdominal aortic aneurysm (AAA) screening. You may need this if you are a current or former smoker. Osteoporosis. You may be screened starting at age 12 if you are at high risk. Talk with your health care provider about your test results, treatment options, and if necessary, the need for more tests. Vaccines  Your health care provider may recommend certain vaccines, such as: Influenza vaccine. This is recommended every year. Tetanus, diphtheria, and acellular pertussis (Tdap, Td) vaccine. You may need a Td booster every 10 years.  Zoster vaccine. You may need this after age 21. Pneumococcal 13-valent conjugate (PCV13) vaccine.  One dose is recommended after age 26. Pneumococcal polysaccharide (PPSV23) vaccine. One dose is recommended after age 48. Talk to your health care provider about which screenings and vaccines you need and how often you need them. This information is not intended to replace advice given to you by your health care provider. Make sure you discuss any questions you have with your health care provider. Document Released: 10/04/2015 Document Revised: 05/27/2016 Document Reviewed: 07/09/2015 Elsevier Interactive Patient Education  2017 [place] Prevention in the Home Falls can cause injuries. They can happen to people of all ages. There are many things you can do to make your home safe and to help prevent falls. What can I do on the outside of my home? Regularly fix the edges of walkways and driveways and fix any cracks. Remove anything that might make you trip as you walk through a door, such as a raised step or threshold. Trim any bushes or trees on the path to your home. Use bright outdoor lighting. Clear any walking paths of anything that might make someone trip, such as rocks or tools. Regularly check to see if handrails are loose or broken. Make sure that both sides of any steps have handrails. Any raised decks and porches should have guardrails on the edges. Have any leaves, snow, or ice cleared regularly. Use sand or salt on walking paths during winter. Clean up any spills in your garage right away. This includes oil or grease spills. What can I do in the bathroom? Use night lights. Install grab bars by the toilet and in the tub and shower. Do not use towel bars as grab bars. Use non-skid mats or decals in the tub or shower. If you need to sit down in the shower, use a plastic, non-slip stool. Keep the floor dry. Clean up any water that spills on the floor as soon as it happens. Remove soap buildup in the tub or shower regularly. Attach bath mats securely with double-sided  non-slip rug tape. Do not have throw rugs and other things on the floor that can make you trip. What can I do in the bedroom? Use night lights. Make sure that you have a light by your bed that is easy to reach. Do not use any sheets or blankets that are too big for your bed. They should not hang down onto the floor. Have a firm chair that has side arms. You can use this for support while you get dressed. Do not have throw rugs and other things on the floor that can make you trip. What can I do in the kitchen? Clean up any spills right away. Avoid walking on wet floors. Keep items that you use a lot in easy-to-reach places. If you need to reach something above you, use a strong step stool that has a grab bar. Keep electrical cords out of the way. Do not use floor polish or wax that makes floors slippery. If you must use wax, use non-skid floor wax. Do not have throw rugs and other things on the floor that can make you trip. What can I do with my stairs? Do not leave any items on the stairs. Make sure that there are handrails on both sides of the stairs and use them. Fix handrails that are broken or loose. Make sure that handrails are as long as the stairways. Check any carpeting to make sure that  it is firmly attached to the stairs. Fix any carpet that is loose or worn. Avoid having throw rugs at the top or bottom of the stairs. If you do have throw rugs, attach them to the floor with carpet tape. Make sure that you have a light switch at the top of the stairs and the bottom of the stairs. If you do not have them, ask someone to add them for you. What else can I do to help prevent falls? Wear shoes that: Do not have high heels. Have rubber bottoms. Are comfortable and fit you well. Are closed at the toe. Do not wear sandals. If you use a stepladder: Make sure that it is fully opened. Do not climb a closed stepladder. Make sure that both sides of the stepladder are locked into place. Ask  someone to hold it for you, if possible. Clearly mark and make sure that you can see: Any grab bars or handrails. First and last steps. Where the edge of each step is. Use tools that help you move around (mobility aids) if they are needed. These include: Canes. Walkers. Scooters. Crutches. Turn on the lights when you go into a dark area. Replace any light bulbs as soon as they burn out. Set up your furniture so you have a clear path. Avoid moving your furniture around. If any of your floors are uneven, fix them. If there are any pets around you, be aware of where they are. Review your medicines with your doctor. Some medicines can make you feel dizzy. This can increase your chance of falling. Ask your doctor what other things that you can do to help prevent falls. This information is not intended to replace advice given to you by your health care provider. Make sure you discuss any questions you have with your health care provider. Document Released: 07/04/2009 Document Revised: 02/13/2016 Document Reviewed: 10/12/2014 Elsevier Interactive Patient Education  2017 [employer].

## 2022-02-09 NOTE — Progress Notes (Signed)
Subjective:   Jason [name] is a 86 y.o. male who presents for Medicare Annual/Subsequent preventive examination.  Review of Systems     Cardiac Risk Factors include: advanced age (>47men, >21 women);dyslipidemia;hypertension;male gender     Objective:    Today's Vitals   02/09/22 1345  BP: 130/70  Pulse: 85  Temp: (!) 97.4 F (36.3 C)  SpO2: 98%  Weight: 201 lb 9.6 oz (91.4 kg)   Body mass index is 28.12 kg/m.     02/09/2022    1:56 PM 02/03/2021    1:46 PM 07/11/2019    2:13 PM  Advanced Directives  Does Patient Have a Medical Advance Directive? Yes Yes Yes  Type of Estate agent of [place];Living will Living will Living will;Healthcare Power of Attorney  Does patient want to make changes to medical advance directive?   No - Patient declined  Copy of Healthcare Power of Attorney in Chart? No - copy requested No - copy requested No - copy requested    Current Medications (verified) Outpatient Encounter Medications as of 02/09/2022  Medication Sig   amLODipine (NORVASC) 10 MG tablet Take 1 tablet (10 mg total) by mouth at bedtime.   amoxicillin (AMOXIL) 500 MG capsule Take 500 mg by mouth as needed. Take 4 capsules 1 hour before dental appointments.   docusate sodium (COLACE) 50 MG capsule Take 50 mg by mouth daily.   LORazepam (ATIVAN) 2 MG tablet Take 2 mg by mouth every 6 (six) hours as needed for anxiety.   senna (SENOKOT) 8.6 MG tablet Take 1 tablet by mouth daily.   vitamin B-12 (CYANOCOBALAMIN) 1000 MCG tablet Take 1,000 mcg by mouth daily.   [DISCONTINUED] OVER THE COUNTER MEDICATION Take 8.6 mg by mouth daily. Take 1 Tablet of SENNESIDES daily.   [DISCONTINUED] OVER THE COUNTER MEDICATION Take 4 mg by mouth daily. Takes 2 capsules of CHLOR TABS daily.   No facility-administered encounter medications on file as of 02/09/2022.    Allergies (verified) Patient has no known allergies.   History: Past Medical History:  Diagnosis Date    Ankylosing spondylitis (HCC)    Diverticulosis of colon (without mention of hemorrhage)    Hypertension    Rheumatoid arthritis(714.0)    Unspecified hearing loss    Past Surgical History:  Procedure Laterality Date   ADENOIDECTOMY     CATARACT EXTRACTION, BILATERAL  2020   EYE SURGERY     cataract both eye    INGUINAL HERNIA REPAIR     x3    OTHER SURGICAL HISTORY  10/2018   urolift   REVISION TOTAL HIP ARTHROPLASTY  1978/1992   3 hip replacements   TONSILLECTOMY     Family History  Problem Relation Age of Onset   Heart disease Mother    Diabetes Mother    Diabetes Father    Prostate cancer Brother        x2   Diabetes Sister        and migraines   Social History   Socioeconomic History   Marital status: Married    Spouse name: Not on file   Number of children: Not on file   Years of education: Not on file   Highest education level: Not on file  Occupational History   Occupation: retired  Tobacco Use   Smoking status: Never   Smokeless tobacco: Never  Substance and Sexual Activity   Alcohol use: Not Currently    Alcohol/week: 0.0 standard drinks  Comment: rare beer   Drug use: No   Sexual activity: Not on file  Other Topics Concern   Not on file  Social History Narrative   Married (wife pt outside practice). 1 son. 3 grandkids.       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 [employer]      Hobbies: golf, time around house working outside, coin collecting   Social Determinants of Corporate investment banker Strain: Low Risk    Difficulty of Paying Living Expenses: Not hard at all  Food Insecurity: No Food Insecurity   Worried About Programme researcher, broadcasting/film/video in the Last Year: Never true   Barista in the Last Year: Never true  Transportation Needs: No Transportation Needs   Lack of Transportation (Medical): No   Lack of Transportation (Non-Medical): No  Physical Activity: Sufficiently Active   Days of Exercise per Week:  7 days   Minutes of Exercise per Session: 30 min  Stress: No Stress Concern Present   Feeling of Stress : Only a little  Social Connections: Moderately Integrated   Frequency of Communication with Friends and Family: More than three times a week   Frequency of Social Gatherings with Friends and Family: Once a week   Attends Religious Services: 1 to 4 times per year   Active Member of [employer] or Organizations: No   Attends Engineer, structural: Never   Marital Status: Married    Tobacco Counseling Counseling given: Not Answered   Clinical Intake:  Pre-visit preparation completed: Yes  Pain : No/denies pain     BMI - recorded: 28.12 Nutritional Status: BMI 25 -29 Overweight Diabetes: No  How often do you need to have someone help you when you read instructions, pamphlets, or other written materials from your doctor or pharmacy?: 1 - Never  Diabetic?No  Interpreter Needed?: No  Information entered by :: Lanier Ensign, LPN   Activities of Daily Living    02/09/2022    1:57 PM  In your present state of health, do you have any difficulty performing the following activities:  Hearing? 0  Vision? 0  Difficulty concentrating or making decisions? 0  Walking or climbing stairs? 0  Dressing or bathing? 0  Doing errands, shopping? 0  Preparing Food and eating ? N  Using the Toilet? N  In the past six months, have you accidently leaked urine? N  Comment urgency  Do you have problems with loss of bowel control? N  Managing your Medications? N  Managing your Finances? N  Housekeeping or managing your Housekeeping? N    Patient Care Team: Shelva Majestic, MD as PCP - General (Family Medicine) Durene Romans, MD as Consulting Physician (Orthopedic Surgery) Heloise Purpura, MD as Consulting Physician (Urology) [facility] [place] [place]  as Consulting Physician (Ophthalmology)  Indicate any recent Medical Services you may have received from other than [facility]  providers in the past year (date may be approximate).     Assessment:   This is a routine wellness examination for [place].  Hearing/Vision screen Hearing Screening - Comments:: Pt wears hearing aids  Vision Screening - Comments:: Pt follows up with Dr Drucilla Schmidt for annual eye exams   Dietary issues and exercise activities discussed: Current Exercise Habits: Home exercise routine, Type of exercise: Other - see comments (stationary bike), Time (Minutes): 30, Frequency (Times/Week): 7, Weekly Exercise (Minutes/Week): 210   Goals Addressed  This Visit's Progress    Patient Stated       Maintain weight       Depression Screen    02/09/2022    1:53 PM 07/08/2021    2:23 PM 02/03/2021    1:44 PM 03/18/2020    1:14 PM 09/11/2019    1:55 PM 07/11/2019    2:14 PM 03/13/2019    2:19 PM  PHQ 2/9 Scores  PHQ - 2 Score 1 0 1 0 0 0 0    Fall Risk    02/09/2022    1:57 PM 07/08/2021    2:23 PM 02/03/2021    1:48 PM 07/11/2019    2:14 PM 03/13/2019    2:20 PM  Fall Risk   Falls in the past year? 0 0 0 0 0  Number falls in past yr: 0 0 0  0  Injury with Fall? 0 0 0 0 0  Risk for fall due to : Impaired vision No Fall Risks Impaired vision Impaired mobility   Follow up Falls prevention discussed Falls evaluation completed Falls prevention discussed Falls evaluation completed;Education provided;Falls prevention discussed Falls evaluation completed    FALL RISK PREVENTION PERTAINING TO THE HOME:  Any stairs in or around the home? Yes  If so, are there any without handrails? No  Home free of loose throw rugs in walkways, pet beds, electrical cords, etc? Yes  Adequate lighting in your home to reduce risk of falls? Yes   ASSISTIVE DEVICES UTILIZED TO PREVENT FALLS:  Life alert? No  Use of a cane, walker or w/c? No  Grab bars in the bathroom? Yes  Shower chair or bench in shower? No  Elevated toilet seat or a handicapped toilet? No   TIMED UP AND GO:  Was the test  performed? Yes .  Length of time to ambulate 10 feet: 10 sec.   Gait steady and fast without use of assistive device  Cognitive Function:        02/09/2022    1:58 PM 02/03/2021    1:52 PM 07/11/2019    2:15 PM  6CIT Screen  What Year? 0 points 0 points 0 points  What month? 0 points 0 points 0 points  What time? 0 points  0 points  Count back from 20 0 points 0 points 0 points  Months in reverse 0 points 0 points 0 points  Repeat phrase 0 points 0 points 0 points  Total Score 0 points  0 points    Immunizations Immunization History  Administered Date(s) Administered   Fluad Quad(high Dose 65+) 06/15/2019, 06/26/2020, 07/07/2021   Influenza Split 09/27/2012   Influenza Whole 06/06/2008, 07/03/2010   Influenza, High Dose Seasonal PF 06/07/2018   Influenza,inj,Quad PF,6+ Mos 06/26/2014, 07/04/2015   Influenza-Unspecified 08/05/2016   Moderna SARS-COV2 Booster Vaccination 09/06/2020   Moderna Sars-Covid-2 Vaccination 10/11/2019, 11/03/2019, 07/16/2021   Pneumococcal Conjugate-13 11/26/2014   Pneumococcal Polysaccharide-23 08/28/2004   Td 08/22/1999, 07/03/2010   Tetanus 04/28/2021   Zoster Recombinat (Shingrix) 02/05/2017, 05/10/2017   Zoster, Live 06/06/2008    TDAP status: Up to date  Flu Vaccine status: Up to date  Pneumococcal vaccine status: Up to date  Covid-19 vaccine status: Completed vaccines  Qualifies for Shingles Vaccine? Yes   Zostavax completed Yes   Shingrix Completed?: Yes  Screening Tests Health Maintenance  Topic Date Due   COVID-19 Vaccine (4 - Booster) 09/10/2021   INFLUENZA VACCINE  04/21/2022   TETANUS/TDAP  04/29/2031   Pneumonia Vaccine 10965+ Years old  Completed   Zoster Vaccines- Shingrix  Completed   HPV VACCINES  Aged Out    Health Maintenance  Health Maintenance Due  Topic Date Due   COVID-19 Vaccine (4 - Booster) 09/10/2021    Colorectal cancer screening: No longer required.    Additional Screening:   Vision  Screening: Recommended annual ophthalmology exams for early detection of glaucoma and other disorders of the eye. Is the patient up to date with their annual eye exam?  Yes  Who is the provider or what is the name of the office in which the patient attends annual eye exams? Dr Drucilla Schmidt  If pt is not established with a provider, would they like to be referred to a provider to establish care? No .   Dental Screening: Recommended annual dental exams for proper oral hygiene  Community Resource Referral / Chronic Care Management: CRR required this visit?  No   CCM required this visit?  No      Plan:     I have personally reviewed and noted the following in the patient's chart:   Medical and social history Use of alcohol, tobacco or illicit drugs  Current medications and supplements including opioid prescriptions. Patient is not currently taking opioid prescriptions. Functional ability and status Nutritional status Physical activity Advanced directives List of other physicians Hospitalizations, surgeries, and ER visits in previous 12 months Vitals Screenings to include cognitive, depression, and falls Referrals and appointments  In addition, I have reviewed and discussed with patient certain preventive protocols, quality metrics, and best practice recommendations. A written personalized care plan for preventive services as well as general preventive health recommendations were provided to patient.     Marzella Schlein, LPN   7/59/1638   Nurse Notes: none

## 2022-03-26 ENCOUNTER — Ambulatory Visit (INDEPENDENT_AMBULATORY_CARE_PROVIDER_SITE_OTHER): Payer: Medicare Other | Admitting: Family Medicine

## 2022-03-26 ENCOUNTER — Encounter: Payer: Self-pay | Admitting: Family Medicine

## 2022-03-26 VITALS — BP 136/70 | HR 55 | Temp 98.4°F | Ht 71.0 in | Wt 199.4 lb

## 2022-03-26 DIAGNOSIS — R739 Hyperglycemia, unspecified: Secondary | ICD-10-CM

## 2022-03-26 DIAGNOSIS — I10 Essential (primary) hypertension: Secondary | ICD-10-CM | POA: Diagnosis not present

## 2022-03-26 DIAGNOSIS — M450 Ankylosing spondylitis of multiple sites in spine: Secondary | ICD-10-CM

## 2022-03-26 DIAGNOSIS — E538 Deficiency of other specified B group vitamins: Secondary | ICD-10-CM

## 2022-03-26 DIAGNOSIS — E785 Hyperlipidemia, unspecified: Secondary | ICD-10-CM | POA: Diagnosis not present

## 2022-03-26 DIAGNOSIS — R351 Nocturia: Secondary | ICD-10-CM

## 2022-03-26 DIAGNOSIS — N401 Enlarged prostate with lower urinary tract symptoms: Secondary | ICD-10-CM

## 2022-03-26 DIAGNOSIS — Z0000 Encounter for general adult medical examination without abnormal findings: Secondary | ICD-10-CM

## 2022-03-26 LAB — LIPID PANEL
Cholesterol: 157 mg/dL (ref 0–200)
HDL: 36.2 mg/dL — ABNORMAL LOW (ref >39.00)
LDL Cholesterol: 100 mg/dL — ABNORMAL HIGH (ref 0–99)
NonHDL: 121.26
Total CHOL/HDL Ratio: 4
Triglycerides: 104 mg/dL (ref 0.0–149.0)
VLDL: 20.8 mg/dL (ref 0.0–40.0)

## 2022-03-26 LAB — COMPREHENSIVE METABOLIC PANEL
ALT: 13 U/L (ref 0–53)
AST: 15 U/L (ref 0–37)
Albumin: 4.4 g/dL (ref 3.5–5.2)
Alkaline Phosphatase: 75 U/L (ref 39–117)
BUN: 20 mg/dL (ref 6–23)
CO2: 28 mEq/L (ref 19–32)
Calcium: 9.5 mg/dL (ref 8.4–10.5)
Chloride: 102 mEq/L (ref 96–112)
Creatinine, Ser: 1.16 mg/dL (ref 0.40–1.50)
GFR: 58.01 mL/min — ABNORMAL LOW (ref >60.00)
Glucose, Bld: 112 mg/dL — ABNORMAL HIGH (ref 70–99)
Potassium: 5 mEq/L (ref 3.5–5.1)
Sodium: 137 mEq/L (ref 135–145)
Total Bilirubin: 0.5 mg/dL (ref 0.2–1.2)
Total Protein: 7.7 g/dL (ref 6.0–8.3)

## 2022-03-26 LAB — CBC WITH DIFFERENTIAL/PLATELET
Basophils Absolute: 0.1 10*3/uL (ref 0.0–0.1)
Basophils Relative: 1 % (ref 0.0–3.0)
Eosinophils Absolute: 0.2 10*3/uL (ref 0.0–0.7)
Eosinophils Relative: 3.1 % (ref 0.0–5.0)
HCT: 39.8 % (ref 39.0–52.0)
Hemoglobin: 13.3 g/dL (ref 13.0–17.0)
Lymphocytes Relative: 27.4 % (ref 12.0–46.0)
Lymphs Abs: 1.9 10*3/uL (ref 0.7–4.0)
MCHC: 33.3 g/dL (ref 30.0–36.0)
MCV: 94 fl (ref 78.0–100.0)
Monocytes Absolute: 0.5 10*3/uL (ref 0.1–1.0)
Monocytes Relative: 7.5 % (ref 3.0–12.0)
Neutro Abs: 4.3 10*3/uL (ref 1.4–7.7)
Neutrophils Relative %: 61 % (ref 43.0–77.0)
Platelets: 259 10*3/uL (ref 150.0–400.0)
RBC: 4.23 Mil/uL (ref 4.22–5.81)
RDW: 13.2 % (ref 11.5–15.5)
WBC: 7 10*3/uL (ref 4.0–10.5)

## 2022-03-26 LAB — HEMOGLOBIN A1C: Hgb A1c MFr Bld: 6.2 % (ref 4.6–6.5)

## 2022-03-26 LAB — VITAMIN B12: Vitamin B-12: 461 pg/mL (ref 211–911)

## 2022-03-26 NOTE — Patient Instructions (Addendum)
Health Maintenance Due  Topic Date Due   COVID-19 Vaccine (4 - Moderna series)  Waiting until new covid19 vaccine comes out in the fall 09/10/2021   Please stop by lab before you go If you have mychart- we will send your results within 3 business days of [place] receiving them.  If you do not have mychart- we will call you about results within 5 business days of [place] receiving them.  *please also note that you will see labs on mychart as soon as they post. I will later go in and write notes on them- will say "notes from Dr. Durene Cal"   Recommended follow up: Return in about 6 months (around 09/26/2022) for followup or sooner if needed.Schedule b4 you leave.

## 2022-03-26 NOTE — Progress Notes (Signed)
Phone: [phone]   Subjective:  Patient presents today for their annual physical. Chief complaint-noted.   See problem oriented charting- ROS- full  review of systems was completed and negative  except for: urinary frequency, urinary urgency, ankle swelling, sleep issues, feels more anxious with age  The following were reviewed and entered/updated in epic: Past Medical History:  Diagnosis Date   Ankylosing spondylitis (HCC)    Diverticulosis of colon (without mention of hemorrhage)    Hypertension    Rheumatoid arthritis(714.0)    Unspecified hearing loss    Patient Active Problem List   Diagnosis Date Noted   B12 deficiency 03/18/2020    Priority: Medium    Hyperlipidemia 02/04/2017    Priority: Medium    BPH associated with nocturia 11/26/2014    Priority: Medium    Hyperglycemia 11/26/2014    Priority: Medium    Essential hypertension 12/30/2009    Priority: Medium    Bowel incontinence 07/04/2015    Priority: Low   Hearing loss 06/06/2008    Priority: Low   Ankylosing spondylitis (HCC) 10/25/2007    Priority: Low   Paresthesias 05/06/2018   Past Surgical History:  Procedure Laterality Date   ADENOIDECTOMY     CATARACT EXTRACTION, BILATERAL  2020   EYE SURGERY     cataract both eye    INGUINAL HERNIA REPAIR     x3    OTHER SURGICAL HISTORY  10/2018   urolift   REVISION TOTAL HIP ARTHROPLASTY  1978/1992   3 hip replacements   TONSILLECTOMY      Family History  Problem Relation Age of Onset   Heart disease Mother    Diabetes Mother    Diabetes Father    Prostate cancer Brother        x2   Diabetes Sister        and migraines    Medications- reviewed and updated Current Outpatient Medications  Medication Sig Dispense Refill   amLODipine (NORVASC) 10 MG tablet Take 1 tablet (10 mg total) by mouth at bedtime. 90 tablet 3   amoxicillin (AMOXIL) 500 MG capsule Take 500 mg by mouth as needed. Take 4 capsules 1 hour before dental appointments.      docusate sodium (COLACE) 50 MG capsule Take 50 mg by mouth daily.     senna (SENOKOT) 8.6 MG tablet Take 1 tablet by mouth daily.     vitamin B-12 (CYANOCOBALAMIN) 1000 MCG tablet Take 1,000 mcg by mouth daily.     No current facility-administered medications for this visit.    Allergies-reviewed and updated No Known Allergies  Social History   Social History Narrative   Married (wife pt outside practice). 1 son. 3 grandkids.       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 Oropharynx normal Neck: no thyromegaly CV: RRR no murmurs rubs or gallops Lungs: CTAB no crackles, wheeze, rhonchi Abdomen: soft/nontender/nondistended/normal bowel sounds. No rebound or guarding.  Ext: minimal edema Skin: warm, dry Neuro: grossly normal, moves all extremities, PERRLA    Assessment and Plan  86 y.o. male presenting  for annual physical.  Health Maintenance counseling: 1. Anticipatory guidance: Patient counseled regarding regular dental exams -q6 months, eye exams -yearly,  avoiding smoking and second hand smoke , limiting alcohol to 2 beverages per day - doesn't drink, no illicit dru.   2. Risk factor reduction:  Advised patient of need for regular exercise and diet rich and fruits and vegetables to reduce risk of heart attack and stroke.  Exercise- stationary bike 30 mins daily.  Diet/weight management-Down 3 pounds from last physical-congratulated patient Wt Readings from Last 3 Encounters:  03/26/22 199 lb 6.4 oz (90.4 kg)  02/09/22 201 lb 9.6 oz (91.4 kg)  07/08/21 202 lb 9.6 oz (91.9 kg)  3. Immunizations/screenings/ancillary studies- he will wait on new  covid shot Immunization History  Administered Date(s) Administered   Fluad Quad(high Dose 65+) 06/15/2019, 06/26/2020, 07/07/2021   Influenza Split 09/27/2012   Influenza Whole 06/06/2008, 07/03/2010   Influenza, High Dose Seasonal PF 06/07/2018   Influenza,inj,Quad PF,6+ Mos 06/26/2014, 07/04/2015   Influenza-Unspecified 08/05/2016   Moderna SARS-COV2 Booster Vaccination 09/06/2020   Moderna Sars-Covid-2 Vaccination 10/11/2019, 11/03/2019, 07/16/2021   Pneumococcal Conjugate-13 11/26/2014   Pneumococcal Polysaccharide-23 08/28/2004   Td 08/22/1999, 07/03/2010   Tetanus 04/28/2021   Zoster Recombinat (Shingrix) 02/05/2017, 05/10/2017   Zoster, Live 06/06/2008  4. Prostate cancer screening- past age based screening recommendations but does still discuss with urology 5. Colon cancer screening -Per [facility] said no further colonoscopy 6. Skin cancer screening-does not see dermatology. advised regular sunscreen use. Denies worrisome, changing, or new skin lesions.  7. Smoking associated screening (lung cancer screening, AAA screen 65-75, UA)- Never smoker 8. STD screening - opts out as monogamous  Status of chronic or acute concerns   #social update- wife not sleeping well and has several health issues.   #hypertension S: medication: Amlodipine 10 mg (does get some edema) -Avoid hydrochlorothiazide due to BPH issues -Consider ACE or ARB as next step if needed Home readings #s: reports good control BP Readings from Last 3 Encounters:  03/26/22 136/70  02/09/22 130/70  07/08/21 138/72  A/P: Controlled. Continue current medications.     #hyperlipidemia S: Medication:none  Lab Results  Component Value Date   CHOL 176 03/20/2021   HDL 36.60 (L) 03/20/2021   LDLCALC 108 (H) 03/20/2021   LDLDIRECT 127.0 11/26/2014   TRIG 159.0 (H) 03/20/2021   CHOLHDL 5 03/20/2021  A/P: Past clear age based screening recommendations for statin use- discussed working on healthy lifestyle    #  Hyperglycemia/insulin resistance/prediabetes June 2022 up to 6.3 A1c S:  Medication: none Exercise and diet- see above A/P: hopefully improved- update a1c today. Continue current meds for now   #BPH with nocturia S: Unfortunately UroLift and medications have not been helpful  A/P: stable but not well controlled- will monitor    # B12 deficiency S: Current treatment/medication (oral vs. IM): 1000 mcg B12 orally daily A/P: hopefully stable- update ba12 today. Continue current meds for now   #Ankylosing spondylitis-final diagnosis after rheumatoid arthritis diagnosis.  Intermittent back pain issues still particular for truly hard.  Has required prior surgeries.  Stable on no medication- wants to monitor without meds   Recommended follow up: Return in about 6 months (around 09/26/2022) for followup or sooner if needed.Schedule b4 you leave. Future Appointments  Date Time Provider Department Center  02/15/2023  1:30 PM [facility]   Lab/Order associations: fasting   ICD-10-CM   1. Preventative health care  Z00.00  2. Hyperlipidemia, unspecified hyperlipidemia type  E78.5     3. Essential hypertension  I10     4. Hyperglycemia  R73.9     5. BPH associated with nocturia  N40.1    R35.1     6. B12 deficiency  E53.8     7. Ankylosing spondylitis of multiple sites in spine (HCC) Chronic M45.0       No orders of the defined types were placed in this encounter.   Return precautions advised.  Tana Conch, MD

## 2022-05-08 ENCOUNTER — Other Ambulatory Visit: Payer: Self-pay | Admitting: Family Medicine

## 2022-06-10 ENCOUNTER — Encounter: Payer: Self-pay | Admitting: Family Medicine

## 2022-06-10 ENCOUNTER — Telehealth (INDEPENDENT_AMBULATORY_CARE_PROVIDER_SITE_OTHER): Payer: Medicare Other | Admitting: Family Medicine

## 2022-06-10 VITALS — Ht 71.0 in | Wt 198.0 lb

## 2022-06-10 DIAGNOSIS — I10 Essential (primary) hypertension: Secondary | ICD-10-CM

## 2022-06-10 DIAGNOSIS — U071 COVID-19: Secondary | ICD-10-CM

## 2022-06-10 MED ORDER — MOLNUPIRAVIR EUA 200MG CAPSULE: 4.0000 | ORAL_CAPSULE | Freq: Two times a day (BID) | ORAL | 0 refills | Status: AC

## 2022-06-10 NOTE — Progress Notes (Signed)
Phone [phone] Virtual visit via Video note   Subjective:  Chief complaint: Chief Complaint  Patient presents with   Covid Positive    Pt states tested positive for covid on 06/10/22 with symptoms of runny nose, cough and congestion, pt denies fever still has taste and smell.    This visit type was conducted due to national recommendations for restrictions regarding the COVID-19 Pandemic (e.g. social distancing).  This format is felt to be most appropriate for this patient at this time balancing risks to patient and risks to population by having him in for in person visit.  No physical exam was performed (except for noted visual exam or audio findings with Telehealth visits).    Our team/I connected with Jason [name] at  3:00 PM EDT by a video enabled telemedicine application (doxy.me or caregility through epic) and verified that I am speaking with the correct person using two identifiers.  Location patient: [place] Location provider: [facility] office Persons participating in the virtual visit:  patient  Our team/I discussed the limitations of evaluation and management by telemedicine and the availability of in person appointments. In light of current covid-19 pandemic, patient also understands that we are trying to protect them by minimizing in office contact if at all possible.  The patient expressed consent for telemedicine visit and agreed to proceed. Patient understands insurance will be billed.   Past Medical History-  Patient Active Problem List   Diagnosis Date Noted   B12 deficiency 03/18/2020    Priority: Medium    Hyperlipidemia 02/04/2017    Priority: Medium    BPH associated with nocturia 11/26/2014    Priority: Medium    Hyperglycemia 11/26/2014    Priority: Medium    Essential hypertension 12/30/2009    Priority: Medium    Bowel incontinence 07/04/2015    Priority: Low   Hearing loss 06/06/2008    Priority: Low   Ankylosing spondylitis ([place])  10/25/2007    Priority: Low   Paresthesias 05/06/2018    Medications- reviewed and updated Current Outpatient Medications  Medication Sig Dispense Refill   amLODipine (NORVASC) 10 MG tablet TAKE 1 TABLET(10 MG) BY MOUTH AT BEDTIME 90 tablet 3   amoxicillin (AMOXIL) 500 MG capsule Take 500 mg by mouth as needed. Take 4 capsules 1 hour before dental appointments.     docusate sodium (COLACE) 50 MG capsule Take 50 mg by mouth daily.     molnupiravir EUA (LAGEVRIO) 200 mg CAPS capsule Take 4 capsules (800 mg total) by mouth 2 (two) times daily for 5 days. 40 capsule 0   senna (SENOKOT) 8.6 MG tablet Take 1 tablet by mouth daily.     vitamin B-12 (CYANOCOBALAMIN) 1000 MCG tablet Take 1,000 mcg by mouth daily.     No current facility-administered medications for this visit.     Objective:  Ht 5\' 11"  (1.803 m)   Wt 198 lb (89.8 kg)   BMI 27.62 kg/m  self reported vitals Gen: NAD, resting with calm voice-no significant cough during visit    Assessment and Plan   #COVID-19 S: Patient tested positive for COVID today.  Complains of symptoms including runny nose, cough, congestion.  No fevers or shortness of breath.  No loss of taste or smell. Son came over on the weekend and spent time with him- son tested positive yesterday - First day of symptoms: yesterday morning   A/P: Patient with testing confirming covid 19 with first day of covid 19 symptoms 06/09/22.  Vaccination  status: Last vaccination October 2022 for COVID-discussed would likely get at least 6 months of protection from current infection   Therefore: - recommended patient watch closely for shortness of breath or confusion or worsening symptoms and if those occur patient should contact us immediately or seek care in the emergency department -recommended patient consider purchasing pulse oximeter and if levels 94% or below persistently- seek care at the hospital - Patient needs to self isolate  for at least 5 days since first  symptom AND at least 24 hours fever free without fever reducing medications AND have improvement in respiratory symptoms . After 5 days can end self isolation but still needs to wear mask for additional 5 days .  -Patient should inform close contacts about exposure (anyone patient been around unmasked for more than 15 minutes)   If High risk for complications-we discussed outpatient therapeutic options including paxlovid (and risk of rebound), molnupiravir -opts in for molnupiravir (due to paxlovid risk with amlodipine)  - may try delsym or mucinex-DM (should avoid decongestants)  #hypertension S: medication: Amlodipine 10 mg (does get some edema) Home readings #s: had one reading of 140/70 but usually less than this- has not checked in a few weeks A/P: Sounds like recently has been well controlled but has not frequently checked.  Hypertension was relevant in our discussion of COVID-19 because amlodipine would be increasing the efficacy on paxlovid so we opted to choose molnupiravir.  We did recommend monitoring blood pressure to make sure not running too low with COVID-prefer for him to stay over 110/60 but on the other hand prefer his average to be less than 135/85 as well for long-term health-he agrees to monitor and we can recheck next visit-for now continue current medication amlodipine 10 mg  Recommended follow up: As needed for new or worsening symptoms or failure to improve Future Appointments  Date Time Provider Department Center  09/25/2022  1:00 PM Shelva Majestic, MD [facility]  02/15/2023  1:30 PM [facility] HEALTH COACH [facility]    Lab/Order associations:   ICD-10-CM   1. COVID-19  U07.1     2. Essential hypertension  I10       Meds ordered this encounter  Medications   molnupiravir EUA (LAGEVRIO) 200 mg CAPS capsule    Sig: Take 4 capsules (800 mg total) by mouth 2 (two) times daily for 5 days.    Dispense:  40 capsule    Refill:  0    Return precautions advised.   Tana Conch, MD

## 2022-06-15 ENCOUNTER — Encounter: Payer: Self-pay | Admitting: *Deleted

## 2022-09-03 ENCOUNTER — Encounter: Payer: Self-pay | Admitting: *Deleted

## 2022-09-25 ENCOUNTER — Encounter: Payer: Self-pay | Admitting: Family Medicine

## 2022-09-25 ENCOUNTER — Telehealth: Payer: Self-pay

## 2022-09-25 ENCOUNTER — Ambulatory Visit (INDEPENDENT_AMBULATORY_CARE_PROVIDER_SITE_OTHER): Payer: Medicare Other | Admitting: Family Medicine

## 2022-09-25 VITALS — BP 138/72 | HR 76 | Temp 98.0°F | Resp 16 | Ht 71.0 in | Wt 198.0 lb

## 2022-09-25 DIAGNOSIS — I10 Essential (primary) hypertension: Secondary | ICD-10-CM | POA: Diagnosis not present

## 2022-09-25 DIAGNOSIS — Z20822 Contact with and (suspected) exposure to covid-19: Secondary | ICD-10-CM

## 2022-09-25 DIAGNOSIS — E785 Hyperlipidemia, unspecified: Secondary | ICD-10-CM

## 2022-09-25 DIAGNOSIS — E538 Deficiency of other specified B group vitamins: Secondary | ICD-10-CM | POA: Diagnosis not present

## 2022-09-25 DIAGNOSIS — M450 Ankylosing spondylitis of multiple sites in spine: Secondary | ICD-10-CM

## 2022-09-25 DIAGNOSIS — Z23 Encounter for immunization: Secondary | ICD-10-CM

## 2022-09-25 LAB — POC COVID19 BINAXNOW: SARS Coronavirus 2 Ag: NEGATIVE

## 2022-09-25 NOTE — Progress Notes (Signed)
Phone [phone] In person visit   Subjective:   Jason [name] is a 87 y.o. year old very pleasant male patient who presents for/with See problem oriented charting Chief Complaint  Patient presents with   Follow-up    Follow up   Past Medical History-  Patient Active Problem List   Diagnosis Date Noted   B12 deficiency 03/18/2020    Priority: Medium    Hyperlipidemia 02/04/2017    Priority: Medium    BPH associated with nocturia 11/26/2014    Priority: Medium    Hyperglycemia 11/26/2014    Priority: Medium    Essential hypertension 12/30/2009    Priority: Medium    Bowel incontinence 07/04/2015    Priority: Low   Hearing loss 06/06/2008    Priority: Low   Ankylosing spondylitis ([place]) 10/25/2007    Priority: Low   Paresthesias 05/06/2018    Medications- reviewed and updated Current Outpatient Medications  Medication Sig Dispense Refill   amLODipine (NORVASC) 10 MG tablet TAKE 1 TABLET(10 MG) BY MOUTH AT BEDTIME 90 tablet 3   amoxicillin (AMOXIL) 500 MG capsule Take 500 mg by mouth as needed. Take 4 capsules 1 hour before dental appointments.     docusate sodium (COLACE) 50 MG capsule Take 50 mg by mouth daily.     senna (SENOKOT) 8.6 MG tablet Take 1 tablet by mouth daily.     vitamin B-12 (CYANOCOBALAMIN) 1000 MCG tablet Take 1,000 mcg by mouth daily.     No current facility-administered medications for this visit.     Objective:  BP 138/72 (BP Location: Right Arm, Patient Position: Sitting, Cuff Size: Normal)   Pulse 76   Temp 98 F (36.7 C) (Oral)   Resp 16   Ht 5\' 11"  (1.803 m)   Wt 198 lb (89.8 kg)   SpO2 96%   BMI 27.62 kg/m  Gen: NAD, resting comfortably CV: RRR no murmurs rubs or gallops Lungs: CTAB no crackles, wheeze, rhonchi Ext: trace to 1+ edema Skin: warm, dry     Assessment and Plan   #social update- reports wife not doing super well- working with sleep center  #exposed to covid on Tuesday of this week by brother- we will  check covid test today but he knows if develops symptoms to retest at home  # Diarrhea/loose stools-:some early morning issues for a few weeks. Doesn't feel poorly afterwards. Tried relaxium for sleep recently and realized that was the cause. He stopped 3-4 days ago and no further issues  #hypertension S: medication: Amlodipine 10 mg (does get some edema) -Avoid hydrochlorothiazide due to BPH issues -Consider ACE or ARB as next step if needed Home readings #s: variable 917, 915, 056 systolic over last few checks. Home cuff appears to run slightly higher than our readings as we got 138/72 and his cuff was 168/83   BP Readings from Last 3 Encounters:  09/25/22 138/72  03/26/22 136/70  02/09/22 130/70   A/P: blood pressure high acceptable range today and home average systolic also 979 over last 3 readings- we discussed possibly increasing by adding 2nd medicine but also risks such as lower blood pressure, fall risk, etc- in the end he preferred to stay on current medicine unless worsening pattern. In addition last 2 BPs were slightly better than even today.    #hyperlipidemia S: Medication: None -Past clear age based screening recommendations for statin use  -still exercising on stationary bike 30 minutes a day.  Lab Results  Component Value Date  CHOL 157 03/26/2022   HDL 36.20 (L) 03/26/2022   LDLCALC 100 (H) 03/26/2022   LDLDIRECT 127.0 11/26/2014   TRIG 104.0 03/26/2022   CHOLHDL 4 03/26/2022   A/P: Mild elevations-we have opted to remain off medicine for primary prevention    # Hyperglycemia/insulin resistance/prediabetes June 2022 up to 6.3 A1c S:  Medication: None Exercise and diet- doing stationary bike, weight stable but diet somewhat looser over holidays   Lab Results  Component Value Date   HGBA1C 6.2 03/26/2022   HGBA1C 6.3 03/20/2021   HGBA1C 6.2 03/18/2020  A/P: offered a1c today- he wants to wait until next visit and reverse some of the looser diet over holidays     # B12 deficiency S: Current treatment/medication (oral vs. IM): 1000 mcg B12 orally daily   Lab Results  Component Value Date   VITAMINB12 461 03/26/2022   A/P: stable- continue current medicines . Recheck next visit.   #Ankylosing spondylitis-final diagnosis after rheumatoid arthritis diagnosis.  Intermittent back pain issues still particular for truly hard.  Has required prior surgeries.  No rx- monitoring- no recent back or spoine issues  Recommended follow up: Return in about 6 months (around 03/26/2023) for physical or sooner if needed.Schedule b4 you leave. Future Appointments  Date Time Provider [place]  02/15/2023  1:30 PM [facility] HEALTH COACH [facility]    Lab/Order associations:   ICD-10-CM   1. Hyperlipidemia, unspecified hyperlipidemia type  E78.5     2. Essential hypertension  I10     3. B12 deficiency  E53.8     4. Ankylosing spondylitis of multiple sites in spine ([place])  M45.0       No orders of the defined types were placed in this encounter.   Return precautions advised.  Garret Reddish, MD

## 2022-09-25 NOTE — Patient Instructions (Addendum)
Team please log flu shot dec 2 at [facility]  Test for covid 19 under covid exposure please. We will call if positive. If you develop symptoms please recheck  Glad you are doing well!   Recommended follow up: Return in about 6 months (around 03/26/2023) for physical or sooner if needed.Schedule b4 you leave.

## 2022-09-25 NOTE — Telephone Encounter (Signed)
Called pt was advised Covid test  Was negative

## 2022-09-25 NOTE — Addendum Note (Signed)
Addended by: Laure Kidney on: 09/25/2022 01:44 PM   Modules accepted: Orders

## 2022-12-10 DIAGNOSIS — K080 Exfoliation of teeth due to systemic causes: Secondary | ICD-10-CM | POA: Diagnosis not present

## 2023-01-25 ENCOUNTER — Telehealth: Payer: Self-pay | Admitting: Family Medicine

## 2023-01-25 NOTE — Telephone Encounter (Signed)
Contacted Jason [name] to schedule their annual wellness visit. Appointment made for 02/16/2023.  [facility]riel Cir[facility] TEAM Direct Dial [phone]

## 2023-02-16 ENCOUNTER — Ambulatory Visit (INDEPENDENT_AMBULATORY_CARE_PROVIDER_SITE_OTHER): Payer: Medicare Other

## 2023-02-16 VITALS — BP 142/78 | HR 84 | Temp 98.1°F | Wt 198.6 lb

## 2023-02-16 DIAGNOSIS — Z0000 Encounter for general adult medical examination without abnormal findings: Secondary | ICD-10-CM

## 2023-02-16 NOTE — Patient Instructions (Signed)
Jason [name] , Thank you for taking time to come for your Medicare Wellness Visit. I appreciate your ongoing commitment to your health goals. Please review the following plan we discussed and let me know if I can assist you in the future.   These are the goals we discussed:  Goals      Patient Stated     Continue to maintain activity level      Patient Stated     Lose weight      Patient Stated     Maintain weight        This is a list of the screening recommended for you and due dates:  Health Maintenance  Topic Date Due   COVID-19 Vaccine (5 - 2023-24 season) 05/22/2022   Flu Shot  04/22/2023   Medicare Annual Wellness Visit  02/16/2024   DTaP/Tdap/Td vaccine (4 - Tdap) 04/29/2031   Pneumonia Vaccine  Completed   Zoster (Shingles) Vaccine  Completed   HPV Vaccine  Aged Out    Advanced directives: Please bring a copy of your health care power of attorney and living will to the office at your convenience.   Conditions/risks identified: stay healthy   Next appointment: Follow up in one year for your annual wellness visit.   Preventive Care 50 Years and Older, Male  Preventive care refers to lifestyle choices and visits with your health care provider that can promote health and wellness. What does preventive care include? A yearly physical exam. This is also called an annual well check. Dental exams once or twice a year. Routine eye exams. Ask your health care provider how often you should have your eyes checked. Personal lifestyle choices, including: Daily care of your teeth and gums. Regular physical activity. Eating a healthy diet. Avoiding tobacco and drug use. Limiting alcohol use. Practicing safe sex. Taking low doses of aspirin every day. Taking vitamin and mineral supplements as recommended by your health care provider. What happens during an annual well check? The services and screenings done by your health care provider during your annual well check will  depend on your age, overall health, lifestyle risk factors, and family history of disease. Counseling  Your health care provider may ask you questions about your: Alcohol use. Tobacco use. Drug use. Emotional well-being. Home and relationship well-being. Sexual activity. Eating habits. History of falls. Memory and ability to understand (cognition). Work and work Astronomer. Screening  You may have the following tests or measurements: Height, weight, and BMI. Blood pressure. Lipid and cholesterol levels. These may be checked every 5 years, or more frequently if you are over 83 years old. Skin check. Lung cancer screening. You may have this screening every year starting at age 94 if you have a 30-pack-year history of smoking and currently smoke or have quit within the past 15 years. Fecal occult blood test (FOBT) of the stool. You may have this test every year starting at age 47. Flexible sigmoidoscopy or colonoscopy. You may have a sigmoidoscopy every 5 years or a colonoscopy every 10 years starting at age 75. Prostate cancer screening. Recommendations will vary depending on your family history and other risks. Hepatitis C blood test. Hepatitis B blood test. Sexually transmitted disease (STD) testing. Diabetes screening. This is done by checking your blood sugar (glucose) after you have not eaten for a while (fasting). You may have this done every 1-3 years. Abdominal aortic aneurysm (AAA) screening. You may need this if you are a current or former smoker. Osteoporosis.  You may be screened starting at age 38 if you are at high risk. Talk with your health care provider about your test results, treatment options, and if necessary, the need for more tests. Vaccines  Your health care provider may recommend certain vaccines, such as: Influenza vaccine. This is recommended every year. Tetanus, diphtheria, and acellular pertussis (Tdap, Td) vaccine. You may need a Td booster every 10  years. Zoster vaccine. You may need this after age 42. Pneumococcal 13-valent conjugate (PCV13) vaccine. One dose is recommended after age 51. Pneumococcal polysaccharide (PPSV23) vaccine. One dose is recommended after age 61. Talk to your health care provider about which screenings and vaccines you need and how often you need them. This information is not intended to replace advice given to you by your health care provider. Make sure you discuss any questions you have with your health care provider. Document Released: 10/04/2015 Document Revised: 05/27/2016 Document Reviewed: 07/09/2015 Elsevier Interactive Patient Education  2017 [employer].  Fall Prevention in the Home Falls can cause injuries. They can happen to people of all ages. There are many things you can do to make your home safe and to help prevent falls. What can I do on the outside of my home? Regularly fix the edges of walkways and driveways and fix any cracks. Remove anything that might make you trip as you walk through a door, such as a raised step or threshold. Trim any bushes or trees on the path to your home. Use bright outdoor lighting. Clear any walking paths of anything that might make someone trip, such as rocks or tools. Regularly check to see if handrails are loose or broken. Make sure that both sides of any steps have handrails. Any raised decks and porches should have guardrails on the edges. Have any leaves, snow, or ice cleared regularly. Use sand or salt on walking paths during winter. Clean up any spills in your garage right away. This includes oil or grease spills. What can I do in the bathroom? Use night lights. Install grab bars by the toilet and in the tub and shower. Do not use towel bars as grab bars. Use non-skid mats or decals in the tub or shower. If you need to sit down in the shower, use a plastic, non-slip stool. Keep the floor dry. Clean up any water that spills on the floor as soon as it  happens. Remove soap buildup in the tub or shower regularly. Attach bath mats securely with double-sided non-slip rug tape. Do not have throw rugs and other things on the floor that can make you trip. What can I do in the bedroom? Use night lights. Make sure that you have a light by your bed that is easy to reach. Do not use any sheets or blankets that are too big for your bed. They should not hang down onto the floor. Have a firm chair that has side arms. You can use this for support while you get dressed. Do not have throw rugs and other things on the floor that can make you trip. What can I do in the kitchen? Clean up any spills right away. Avoid walking on wet floors. Keep items that you use a lot in easy-to-reach places. If you need to reach something above you, use a strong step stool that has a grab bar. Keep electrical cords out of the way. Do not use floor polish or wax that makes floors slippery. If you must use wax, use non-skid floor wax.  Do not have throw rugs and other things on the floor that can make you trip. What can I do with my stairs? Do not leave any items on the stairs. Make sure that there are handrails on both sides of the stairs and use them. Fix handrails that are broken or loose. Make sure that handrails are as long as the stairways. Check any carpeting to make sure that it is firmly attached to the stairs. Fix any carpet that is loose or worn. Avoid having throw rugs at the top or bottom of the stairs. If you do have throw rugs, attach them to the floor with carpet tape. Make sure that you have a light switch at the top of the stairs and the bottom of the stairs. If you do not have them, ask someone to add them for you. What else can I do to help prevent falls? Wear shoes that: Do not have high heels. Have rubber bottoms. Are comfortable and fit you well. Are closed at the toe. Do not wear sandals. If you use a stepladder: Make sure that it is fully opened.  Do not climb a closed stepladder. Make sure that both sides of the stepladder are locked into place. Ask someone to hold it for you, if possible. Clearly mark and make sure that you can see: Any grab bars or handrails. First and last steps. Where the edge of each step is. Use tools that help you move around (mobility aids) if they are needed. These include: Canes. Walkers. Scooters. Crutches. Turn on the lights when you go into a dark area. Replace any light bulbs as soon as they burn out. Set up your furniture so you have a clear path. Avoid moving your furniture around. If any of your floors are uneven, fix them. If there are any pets around you, be aware of where they are. Review your medicines with your doctor. Some medicines can make you feel dizzy. This can increase your chance of falling. Ask your doctor what other things that you can do to help prevent falls. This information is not intended to replace advice given to you by your health care provider. Make sure you discuss any questions you have with your health care provider. Document Released: 07/04/2009 Document Revised: 02/13/2016 Document Reviewed: 10/12/2014 Elsevier Interactive Patient Education  2017 [employer].

## 2023-02-16 NOTE — Progress Notes (Signed)
Subjective:   Jason [name] is a 87 y.o. male who presents for Medicare Annual/Subsequent preventive examination.  Review of Systems     Cardiac Risk Factors include: advanced age (>45men, >29 women);dyslipidemia;hypertension;male gender     Objective:    Today's Vitals   02/16/23 1335 02/16/23 1346  BP: (!) 160/80 (!) 142/78  Pulse: 84   Temp: 98.1 F (36.7 C)   SpO2: 95%   Weight: 198 lb 9.6 oz (90.1 kg)    Body mass index is 27.7 kg/m.     02/16/2023    1:44 PM 02/09/2022    1:56 PM 02/03/2021    1:46 PM 07/11/2019    2:13 PM  Advanced Directives  Does Patient Have a Medical Advance Directive? Yes Yes Yes Yes  Type of Estate agent of [place];Living will Healthcare Power of [place];Living will Living will Living will;Healthcare Power of Attorney  Does patient want to make changes to medical advance directive?    No - Patient declined  Copy of Healthcare Power of Attorney in Chart? No - copy requested No - copy requested No - copy requested No - copy requested    Current Medications (verified) Outpatient Encounter Medications as of 02/16/2023  Medication Sig   amLODipine (NORVASC) 10 MG tablet TAKE 1 TABLET(10 MG) BY MOUTH AT BEDTIME   docusate sodium (COLACE) 50 MG capsule Take 50 mg by mouth daily.   senna (SENOKOT) 8.6 MG tablet Take 1 tablet by mouth daily.   vitamin B-12 (CYANOCOBALAMIN) 1000 MCG tablet Take 1,000 mcg by mouth daily.   amoxicillin (AMOXIL) 500 MG capsule Take 500 mg by mouth as needed. Take 4 capsules 1 hour before dental appointments. (Patient not taking: Reported on 02/16/2023)   No facility-administered encounter medications on file as of 02/16/2023.    Allergies (verified) Patient has no known allergies.   History: Past Medical History:  Diagnosis Date   Ankylosing spondylitis (HCC)    Diverticulosis of colon (without mention of hemorrhage)    Hypertension    Rheumatoid arthritis(714.0)    Unspecified  hearing loss    Past Surgical History:  Procedure Laterality Date   ADENOIDECTOMY     CATARACT EXTRACTION, BILATERAL  2020   EYE SURGERY     cataract both eye    INGUINAL HERNIA REPAIR     x3    OTHER SURGICAL HISTORY  10/2018   urolift   REVISION TOTAL HIP ARTHROPLASTY  1978/1992   3 hip replacements   TONSILLECTOMY     Family History  Problem Relation Age of Onset   Heart disease Mother    Diabetes Mother    Diabetes Father    Prostate cancer Brother        x2   Diabetes Sister        and migraines   Social History   Socioeconomic History   Marital status: Married    Spouse name: Not on file   Number of children: Not on file   Years of education: Not on file   Highest education level: Not on file  Occupational History   Occupation: retired  Tobacco Use   Smoking status: Never   Smokeless tobacco: Never  Substance and Sexual Activity   Alcohol use: Not Currently    Alcohol/week: 0.0 standard drinks of alcohol    Comment: rare beer   Drug use: No   Sexual activity: Not on file  Other Topics Concern   Not on file  Social History Narrative  Married (wife pt outside practice). 1 son. 3 grandkids.       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 [employer]      Hobbies: golf, time around house working outside, coin collecting   Social Determinants of Health   Financial Resource Strain: Low Risk  (02/16/2023)   Overall Financial Resource Strain (CARDIA)    Difficulty of Paying Living Expenses: Not hard at all  Food Insecurity: No Food Insecurity (02/16/2023)   Hunger Vital Sign    Worried About Running Out of Food in the Last Year: Never true    Ran Out of Food in the Last Year: Never true  Transportation Needs: No Transportation Needs (02/16/2023)   PRAPARE - Administrator, Civil Service (Medical): No    Lack of Transportation (Non-Medical): No  Physical Activity: Sufficiently Active (02/16/2023)   Exercise Vital Sign     Days of Exercise per Week: 7 days    Minutes of Exercise per Session: 30 min  Stress: No Stress Concern Present (02/16/2023)   [employer] of Occupational Health - Occupational Stress Questionnaire    Feeling of Stress : Only a little  Social Connections: Moderately Integrated (02/16/2023)   Social Connection and Isolation Panel [NHANES]    Frequency of Communication with Friends and Family: More than three times a week    Frequency of Social Gatherings with Friends and Family: More than three times a week    Attends Religious Services: 1 to 4 times per year    Active Member of [employer] or Organizations: No    Attends Engineer, structural: Never    Marital Status: Married    Tobacco Counseling Counseling given: Not Answered   Clinical Intake:  Pre-visit preparation completed: Yes  Pain : No/denies pain     BMI - recorded: 27.7 Nutritional Status: BMI 25 -29 Overweight Nutritional Risks: None Diabetes: No  How often do you need to have someone help you when you read instructions, pamphlets, or other written materials from your doctor or pharmacy?: 1 - Never  Diabetic?no  Interpreter Needed?: No  Information entered by :: Lanier Ensign, LPN   Activities of Daily Living    02/16/2023    1:44 PM  In your present state of health, do you have any difficulty performing the following activities:  Hearing? 1  Comment wears hearing aids  Vision? 0  Difficulty concentrating or making decisions? 0  Walking or climbing stairs? 0  Dressing or bathing? 0  Doing errands, shopping? 0  Preparing Food and eating ? N  Using the Toilet? N  In the past six months, have you accidently leaked urine? N  Do you have problems with loss of bowel control? N  Managing your Medications? N  Managing your Finances? N  Housekeeping or managing your Housekeeping? N    Patient Care Team: Shelva Majestic, MD as PCP - General (Family Medicine) Durene Romans, MD as Consulting  Physician (Orthopedic Surgery) Heloise Purpura, MD as Consulting Physician (Urology) [facility] [place], [place]  as Consulting Physician (Ophthalmology)  Indicate any recent Medical Services you may have received from other than [facility] providers in the past year (date may be approximate).     Assessment:   This is a routine wellness examination for [place].  Hearing/Vision screen Hearing Screening - Comments:: Wears hearing aids  Vision Screening - Comments:: Pt follows up with Dr Drucilla Schmidt for annual eye exams   Dietary issues and exercise activities discussed: Current  Exercise Habits: Home exercise routine, Type of exercise: Other - see comments (stationary bike), Time (Minutes): 30, Frequency (Times/Week): 7, Weekly Exercise (Minutes/Week): 210   Goals Addressed             This Visit's Progress    Patient Stated       Stay healthy      Depression Screen    02/16/2023    1:41 PM 09/25/2022    1:07 PM 02/09/2022    1:53 PM 07/08/2021    2:23 PM 02/03/2021    1:44 PM 03/18/2020    1:14 PM 09/11/2019    1:55 PM  PHQ 2/9 Scores  PHQ - 2 Score 0 0 1 0 1 0 0    Fall Risk    02/16/2023    1:44 PM 09/25/2022    1:07 PM 02/09/2022    1:57 PM 07/08/2021    2:23 PM 02/03/2021    1:48 PM  Fall Risk   Falls in the past year? 0  0 0 0  Number falls in past yr: 0 0 0 0 0  Injury with Fall? 0 0 0 0 0  Risk for fall due to : Impaired vision  Impaired vision No Fall Risks Impaired vision  Follow up Falls prevention discussed Falls evaluation completed Falls prevention discussed Falls evaluation completed Falls prevention discussed    FALL RISK PREVENTION PERTAINING TO THE HOME:  Any stairs in or around the home? Yes  If so, are there any without handrails? No  Home free of loose throw rugs in walkways, pet beds, electrical cords, etc? Yes  Adequate lighting in your home to reduce risk of falls? Yes   ASSISTIVE DEVICES UTILIZED TO PREVENT FALLS:  Life alert? No  Use of a cane,  walker or w/c? No  Grab bars in the bathroom? No  Shower chair or bench in shower? Yes  Elevated toilet seat or a handicapped toilet? No   TIMED UP AND GO:  Was the test performed? Yes .  Length of time to ambulate 10 feet: 10 sec.   Gait steady and fast without use of assistive device  Cognitive Function:        02/16/2023    1:47 PM 02/09/2022    1:58 PM 02/03/2021    1:52 PM 07/11/2019    2:15 PM  6CIT Screen  What Year? 0 points 0 points 0 points 0 points  What month? 0 points 0 points 0 points 0 points  What time? 0 points 0 points  0 points  Count back from 20 0 points 0 points 0 points 0 points  Months in reverse 0 points 0 points 0 points 0 points  Repeat phrase 0 points 0 points 0 points 0 points  Total Score 0 points 0 points  0 points    Immunizations Immunization History  Administered Date(s) Administered   Fluad Quad(high Dose 65+) 06/15/2019, 06/26/2020, 07/07/2021   Influenza Split 09/27/2012   Influenza Whole 06/06/2008, 07/03/2010   Influenza, High Dose Seasonal PF 06/07/2018   Influenza,inj,Quad PF,6+ Mos 06/26/2014, 07/04/2015   Influenza-Unspecified 08/05/2016   Moderna SARS-COV2 Booster Vaccination 09/06/2020   Moderna Sars-Covid-2 Vaccination 10/11/2019, 11/03/2019, 07/16/2021   Pneumococcal Conjugate-13 11/26/2014   Pneumococcal Polysaccharide-23 08/28/2004   Td 08/22/1999, 07/03/2010   Tetanus 04/28/2021   Zoster Recombinat (Shingrix) 02/05/2017, 05/10/2017   Zoster, Live 06/06/2008    TDAP status: Up to date  Flu Vaccine status: Up to date  Pneumococcal vaccine status: Up to date  Covid-19 vaccine status: Completed vaccines  Qualifies for Shingles Vaccine? Yes   Zostavax completed Yes   Shingrix Completed?: Yes  Screening Tests Health Maintenance  Topic Date Due   COVID-19 Vaccine (5 - 2023-24 season) 05/22/2022   INFLUENZA VACCINE  04/22/2023   Medicare Annual Wellness (AWV)  02/16/2024   DTaP/Tdap/Td (4 - Tdap) 04/29/2031    Pneumonia Vaccine 36+ Years old  Completed   Zoster Vaccines- Shingrix  Completed   HPV VACCINES  Aged Out    Health Maintenance  Health Maintenance Due  Topic Date Due   COVID-19 Vaccine (5 - 2023-24 season) 05/22/2022    Colorectal cancer screening: No longer required.    Additional Screening:   Vision Screening: Recommended annual ophthalmology exams for early detection of glaucoma and other disorders of the eye. Is the patient up to date with their annual eye exam?  Yes  Who is the provider or what is the name of the office in which the patient attends annual eye exams? Dr Drucilla Schmidt  If pt is not established with a provider, would they like to be referred to a provider to establish care? No .   Dental Screening: Recommended annual dental exams for proper oral hygiene  Community Resource Referral / Chronic Care Management: CRR required this visit?  No   CCM required this visit?  No      Plan:     I have personally reviewed and noted the following in the patient's chart:   Medical and social history Use of alcohol, tobacco or illicit drugs  Current medications and supplements including opioid prescriptions. Patient is not currently taking opioid prescriptions. Functional ability and status Nutritional status Physical activity Advanced directives List of other physicians Hospitalizations, surgeries, and ER visits in previous 12 months Vitals Screenings to include cognitive, depression, and falls Referrals and appointments  In addition, I have reviewed and discussed with patient certain preventive protocols, quality metrics, and best practice recommendations. A written personalized care plan for preventive services as well as general preventive health recommendations were provided to patient.     Marzella Schlein, LPN   12/28/8117   Nurse Notes: none

## 2023-04-21 ENCOUNTER — Encounter (INDEPENDENT_AMBULATORY_CARE_PROVIDER_SITE_OTHER): Payer: Self-pay

## 2023-05-06 ENCOUNTER — Ambulatory Visit (INDEPENDENT_AMBULATORY_CARE_PROVIDER_SITE_OTHER): Payer: Medicare Other | Admitting: Family Medicine

## 2023-05-06 ENCOUNTER — Encounter: Payer: Self-pay | Admitting: Family Medicine

## 2023-05-06 VITALS — BP 138/60 | HR 62 | Temp 98.1°F | Ht 71.0 in | Wt 193.8 lb

## 2023-05-06 DIAGNOSIS — E538 Deficiency of other specified B group vitamins: Secondary | ICD-10-CM

## 2023-05-06 DIAGNOSIS — Z125 Encounter for screening for malignant neoplasm of prostate: Secondary | ICD-10-CM

## 2023-05-06 DIAGNOSIS — N401 Enlarged prostate with lower urinary tract symptoms: Secondary | ICD-10-CM

## 2023-05-06 DIAGNOSIS — R350 Frequency of micturition: Secondary | ICD-10-CM

## 2023-05-06 DIAGNOSIS — R739 Hyperglycemia, unspecified: Secondary | ICD-10-CM

## 2023-05-06 DIAGNOSIS — Z0000 Encounter for general adult medical examination without abnormal findings: Secondary | ICD-10-CM | POA: Diagnosis not present

## 2023-05-06 DIAGNOSIS — R351 Nocturia: Secondary | ICD-10-CM

## 2023-05-06 DIAGNOSIS — I10 Essential (primary) hypertension: Secondary | ICD-10-CM

## 2023-05-06 DIAGNOSIS — E785 Hyperlipidemia, unspecified: Secondary | ICD-10-CM | POA: Diagnosis not present

## 2023-05-06 LAB — URINALYSIS, ROUTINE W REFLEX MICROSCOPIC
Bilirubin Urine: NEGATIVE
Hgb urine dipstick: NEGATIVE
Ketones, ur: NEGATIVE
Leukocytes,Ua: NEGATIVE
Nitrite: NEGATIVE
RBC / HPF: NONE SEEN (ref 0–?)
Specific Gravity, Urine: 1.02 (ref 1.000–1.030)
Total Protein, Urine: NEGATIVE
Urine Glucose: NEGATIVE
Urobilinogen, UA: 1 (ref 0.0–1.0)
WBC, UA: NONE SEEN (ref 0–?)
pH: 6.5 (ref 5.0–8.0)

## 2023-05-06 LAB — CBC WITH DIFFERENTIAL/PLATELET
Basophils Absolute: 0.1 10*3/uL (ref 0.0–0.1)
Basophils Relative: 1.1 % (ref 0.0–3.0)
Eosinophils Absolute: 0.1 10*3/uL (ref 0.0–0.7)
Eosinophils Relative: 1.9 % (ref 0.0–5.0)
HCT: 39.8 % (ref 39.0–52.0)
Hemoglobin: 12.9 g/dL — ABNORMAL LOW (ref 13.0–17.0)
Lymphocytes Relative: 28.5 % (ref 12.0–46.0)
Lymphs Abs: 2 10*3/uL (ref 0.7–4.0)
MCHC: 32.3 g/dL (ref 30.0–36.0)
MCV: 93.6 fl (ref 78.0–100.0)
Monocytes Absolute: 0.5 10*3/uL (ref 0.1–1.0)
Monocytes Relative: 7.3 % (ref 3.0–12.0)
Neutro Abs: 4.3 10*3/uL (ref 1.4–7.7)
Neutrophils Relative %: 61.2 % (ref 43.0–77.0)
Platelets: 272 10*3/uL (ref 150.0–400.0)
RBC: 4.25 Mil/uL (ref 4.22–5.81)
RDW: 13.4 % (ref 11.5–15.5)
WBC: 7.1 10*3/uL (ref 4.0–10.5)

## 2023-05-06 LAB — COMPREHENSIVE METABOLIC PANEL
ALT: 12 U/L (ref 0–53)
AST: 14 U/L (ref 0–37)
Albumin: 4.2 g/dL (ref 3.5–5.2)
Alkaline Phosphatase: 90 U/L (ref 39–117)
BUN: 15 mg/dL (ref 6–23)
CO2: 28 mEq/L (ref 19–32)
Calcium: 9.2 mg/dL (ref 8.4–10.5)
Chloride: 100 mEq/L (ref 96–112)
Creatinine, Ser: 1.09 mg/dL (ref 0.40–1.50)
GFR: 62.02 mL/min (ref >60.00)
Glucose, Bld: 102 mg/dL — ABNORMAL HIGH (ref 70–99)
Potassium: 4.1 mEq/L (ref 3.5–5.1)
Sodium: 137 mEq/L (ref 135–145)
Total Bilirubin: 0.6 mg/dL (ref 0.2–1.2)
Total Protein: 7.3 g/dL (ref 6.0–8.3)

## 2023-05-06 LAB — PSA, MEDICARE: PSA: 4.16 ng/ml — ABNORMAL HIGH (ref 0.10–4.00)

## 2023-05-06 LAB — LIPID PANEL
Cholesterol: 159 mg/dL (ref 0–200)
HDL: 33.5 mg/dL — ABNORMAL LOW (ref >39.00)
LDL Cholesterol: 103 mg/dL — ABNORMAL HIGH (ref 0–99)
NonHDL: 125.37
Total CHOL/HDL Ratio: 5
Triglycerides: 113 mg/dL (ref 0.0–149.0)
VLDL: 22.6 mg/dL (ref 0.0–40.0)

## 2023-05-06 LAB — VITAMIN B12: Vitamin B-12: 324 pg/mL (ref 211–911)

## 2023-05-06 LAB — HEMOGLOBIN A1C: Hgb A1c MFr Bld: 6.2 % (ref 4.6–6.5)

## 2023-05-06 NOTE — Patient Instructions (Addendum)
Schedule follow up with eye doctor  Please stop by lab before you go If you have mychart- we will send your results within 3 business days of [place] receiving them.  If you do not have mychart- we will call you about results within 5 business days of [place] receiving them.  *please also note that you will see labs on mychart as soon as they post. I will later go in and write notes on them- will say "notes from Dr. Durene Cal"   Recommended follow up: Return in about 6 months (around 11/06/2023) for followup or sooner if needed.Schedule b4 you leave.

## 2023-05-06 NOTE — Progress Notes (Signed)
Phone: [phone]   Subjective:  Patient presents today for their annual physical. Chief complaint-noted.   See problem oriented charting- ROS- full  review of systems was completed and negative  except for: some ankle swelling on amlodipine- could try compression in winter (doesn't think hed tolerate in summer), urinary urgency worse and frequency  The following were reviewed and entered/updated in epic: Past Medical History:  Diagnosis Date   Ankylosing spondylitis (HCC)    Diverticulosis of colon (without mention of hemorrhage)    Hypertension    Rheumatoid arthritis(714.0)    Unspecified hearing loss    Patient Active Problem List   Diagnosis Date Noted   B12 deficiency 03/18/2020    Priority: Medium    Hyperlipidemia 02/04/2017    Priority: Medium    BPH associated with nocturia 11/26/2014    Priority: Medium    Hyperglycemia 11/26/2014    Priority: Medium    Essential hypertension 12/30/2009    Priority: Medium    Bowel incontinence 07/04/2015    Priority: Low   Hearing loss 06/06/2008    Priority: Low   Ankylosing spondylitis (HCC) 10/25/2007    Priority: Low   Paresthesias 05/06/2018   Past Surgical History:  Procedure Laterality Date   ADENOIDECTOMY     CATARACT EXTRACTION, BILATERAL  2020   EYE SURGERY     cataract both eye    INGUINAL HERNIA REPAIR     x3    OTHER SURGICAL HISTORY  10/2018   urolift   REVISION TOTAL HIP ARTHROPLASTY  1978/1992   3 hip replacements   TONSILLECTOMY      Family History  Problem Relation Age of Onset   Heart disease Mother    Diabetes Mother    Diabetes Father    Prostate cancer Brother        x2   Diabetes Sister        and migraines    Medications- reviewed and updated Current Outpatient Medications  Medication Sig Dispense Refill   amLODipine (NORVASC) 10 MG tablet TAKE 1 TABLET(10 MG) BY MOUTH AT BEDTIME 90 tablet 3   amoxicillin (AMOXIL) 500 MG capsule Take 500 mg by mouth as needed. Take 4 capsules  1 hour before dental appointments.     docusate sodium (COLACE) 50 MG capsule Take 50 mg by mouth daily.     senna (SENOKOT) 8.6 MG tablet Take 1 tablet by mouth daily.     vitamin B-12 (CYANOCOBALAMIN) 1000 MCG tablet Take 1,000 mcg by mouth daily.     No current facility-administered medications for this visit.    Allergies-reviewed and updated No Known Allergies  Social History   Social History Narrative   Married (wife pt outside practice). 1 son. 3 grandkids.       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 Oropharynx normal Neck: no thyromegaly CV: RRR no murmurs rubs or gallops Lungs: CTAB no crackles, wheeze, rhonchi Abdomen: soft/nontender/nondistended/normal bowel sounds. No rebound or guarding.  Ext: no edema Skin: warm, dry Neuro: grossly normal, moves all extremities, PERRLA, wears hearing aids   Assessment  and Plan  87 y.o. male presenting for annual physical.  Health Maintenance counseling: 1. Anticipatory guidance: Patient counseled regarding regular dental exams -q6 months, eye exams - needs to update,  avoiding smoking and second hand smoke , limiting alcohol to 2 beverages per day - doesn't drink, no illicit drugs .   2. Risk factor reduction:  Advised patient of need for regular exercise and diet rich and fruits and vegetables to reduce risk of heart attack and stroke.  Exercise-stationary bike 30 minutes a day as long as things are not too busy at home Diet/weight management-Down 6 pounds from last year-he is trying to eat a healthy balanced diet.  Wt Readings from Last 3 Encounters:  05/06/23 193 lb 12.8 oz (87.9 kg)  02/16/23 198  lb 9.6 oz (90.1 kg)  09/25/22 198 lb (89.8 kg)  3. Immunizations/screenings/ancillary studies-recommended fall flu and COVID shot  Immunization History  Administered Date(s) Administered   Fluad Quad(high Dose 65+) 06/15/2019, 06/26/2020, 07/07/2021   Influenza Split 09/27/2012   Influenza Whole 06/06/2008, 07/03/2010   Influenza, High Dose Seasonal PF 06/07/2018   Influenza,inj,Quad PF,6+ Mos 06/26/2014, 07/04/2015   Influenza-Unspecified 08/05/2016   Moderna SARS-COV2 Booster Vaccination 09/06/2020   Moderna Sars-Covid-2 Vaccination 10/11/2019, 11/03/2019, 07/16/2021   Pneumococcal Conjugate-13 11/26/2014   Pneumococcal Polysaccharide-23 08/28/2004   Td 08/22/1999, 07/03/2010   Tetanus 04/28/2021   Zoster Recombinant(Shingrix) 02/05/2017, 05/10/2017   Zoster, Live 06/06/2008   4. Prostate cancer screening-  past age based screening recommendations - had seen urology for a few years back around 2020 without help for BPH symptoms- with worsening symptoms though we will screen one more time Lab Results  Component Value Date   PSA 2.77 01/24/2016   PSA 2.33 11/26/2014   PSA 2.47 11/15/2013  5. Colon cancer screening - [facility] said no further colonoscopy 6. Skin cancer screening-does not see dermatology. advised regular sunscreen use. Denies worrisome, changing, or new skin lesions- some blotchiness on lower legs 7. Smoking associated screening (lung cancer screening, AAA screen 65-75, UA)-never smoker 8. STD screening -only active with wife  Status of chronic or acute concerns   # Social update-wife was not doing well at last visit and was working with sleep center-today he reports not much improvement   #hypertension S: medication: Amlodipine 10 mg (does get some edema) -Avoid hydrochlorothiazide due to BPH issues -Consider ACE or ARB as next step if needed A/P: high acceptable- continue current medications    #hyperlipidemia S: Medication:Past clear age based screening  recommendations for statin use A/P: update lipids with labs- likely focus on continued lifestyle change    # Hyperglycemia/insulin resistance/prediabetes June 2022 up to 6.3 A1c S:  Medication: none Lab Results  Component Value Date   HGBA1C 6.2 03/26/2022   HGBA1C 6.3 03/20/2021   HGBA1C 6.2 03/18/2020  A/P: #s looked slightly better last check- update today    #BPH with nocturia S: Unfortunately UroLift and medications have not been helpful  A/P: worsening symptoms- urgency and frequency- as a result hed like to check urinalysis, urine culture and psa   # B12 deficiency S: Current treatment/medication (oral vs. IM): 1000 mcg B12 orally daily  Lab Results  Component Value Date   VITAMINB12 461 03/26/2022  A/P: hopefully stable- update b12 today. Continue current meds for now    #Ankylosing spondylitis-final diagnosis after rheumatoid arthritis diagnosis.  Intermittent back pain issues- better lately.  Has required prior surgeries.    Recommended follow up: Return in about 6 months (  around 11/06/2023) for followup or sooner if needed.Schedule b4 you leave. Future Appointments  Date Time Provider Department Center  02/22/2024  1:45 PM [facility] ANNUAL WELLNESS VISIT 1 [facility]   Lab/Order associations: fasting   ICD-10-CM   1. Preventative health care  Z00.00     2. Essential hypertension  I10     3. BPH associated with nocturia  N40.1    R35.1     4. Hyperglycemia  R73.9     5. Hyperlipidemia, unspecified hyperlipidemia type  E78.5     6. B12 deficiency  E53.8     7. Screening for prostate cancer  Z12.5     8. Urinary frequency  R35.0       No orders of the defined types were placed in this encounter.   Return precautions advised.  Tana Conch, MD

## 2023-05-07 LAB — URINE CULTURE
MICRO NUMBER:: 15336073
Result:: NO GROWTH
SPECIMEN QUALITY:: ADEQUATE

## 2023-06-17 DIAGNOSIS — K080 Exfoliation of teeth due to systemic causes: Secondary | ICD-10-CM | POA: Diagnosis not present

## 2023-06-29 DIAGNOSIS — K080 Exfoliation of teeth due to systemic causes: Secondary | ICD-10-CM | POA: Diagnosis not present

## 2023-08-01 ENCOUNTER — Other Ambulatory Visit: Payer: Self-pay | Admitting: Family Medicine

## 2023-12-16 DIAGNOSIS — K080 Exfoliation of teeth due to systemic causes: Secondary | ICD-10-CM | POA: Diagnosis not present

## 2024-02-22 ENCOUNTER — Ambulatory Visit: Payer: Medicare Other

## 2024-03-01 DIAGNOSIS — K080 Exfoliation of teeth due to systemic causes: Secondary | ICD-10-CM | POA: Diagnosis not present

## 2024-05-02 ENCOUNTER — Encounter: Payer: Medicare Other | Admitting: Family Medicine

## 2024-05-04 ENCOUNTER — Ambulatory Visit

## 2024-05-08 ENCOUNTER — Ambulatory Visit (INDEPENDENT_AMBULATORY_CARE_PROVIDER_SITE_OTHER): Payer: Medicare Other | Admitting: Family Medicine

## 2024-05-08 ENCOUNTER — Encounter: Payer: Self-pay | Admitting: Family Medicine

## 2024-05-08 ENCOUNTER — Ambulatory Visit (INDEPENDENT_AMBULATORY_CARE_PROVIDER_SITE_OTHER)

## 2024-05-08 ENCOUNTER — Ambulatory Visit: Payer: Self-pay | Admitting: Family Medicine

## 2024-05-08 VITALS — BP 118/58 | HR 71 | Temp 97.2°F | Ht 71.0 in | Wt 194.4 lb

## 2024-05-08 DIAGNOSIS — R053 Chronic cough: Secondary | ICD-10-CM

## 2024-05-08 DIAGNOSIS — E785 Hyperlipidemia, unspecified: Secondary | ICD-10-CM | POA: Diagnosis not present

## 2024-05-08 DIAGNOSIS — Z125 Encounter for screening for malignant neoplasm of prostate: Secondary | ICD-10-CM | POA: Diagnosis not present

## 2024-05-08 DIAGNOSIS — Z0001 Encounter for general adult medical examination with abnormal findings: Secondary | ICD-10-CM | POA: Diagnosis not present

## 2024-05-08 DIAGNOSIS — R0989 Other specified symptoms and signs involving the circulatory and respiratory systems: Secondary | ICD-10-CM | POA: Diagnosis not present

## 2024-05-08 DIAGNOSIS — Z131 Encounter for screening for diabetes mellitus: Secondary | ICD-10-CM

## 2024-05-08 DIAGNOSIS — R739 Hyperglycemia, unspecified: Secondary | ICD-10-CM

## 2024-05-08 DIAGNOSIS — E538 Deficiency of other specified B group vitamins: Secondary | ICD-10-CM

## 2024-05-08 DIAGNOSIS — I10 Essential (primary) hypertension: Secondary | ICD-10-CM

## 2024-05-08 DIAGNOSIS — R059 Cough, unspecified: Secondary | ICD-10-CM | POA: Diagnosis not present

## 2024-05-08 DIAGNOSIS — M450 Ankylosing spondylitis of multiple sites in spine: Secondary | ICD-10-CM

## 2024-05-08 LAB — CBC WITH DIFFERENTIAL/PLATELET
Basophils Absolute: 0.1 K/uL (ref 0.0–0.1)
Basophils Relative: 0.9 % (ref 0.0–3.0)
Eosinophils Absolute: 0.2 K/uL (ref 0.0–0.7)
Eosinophils Relative: 2.2 % (ref 0.0–5.0)
HCT: 39.6 % (ref 39.0–52.0)
Hemoglobin: 13.2 g/dL (ref 13.0–17.0)
Lymphocytes Relative: 31.6 % (ref 12.0–46.0)
Lymphs Abs: 2.3 K/uL (ref 0.7–4.0)
MCHC: 33.3 g/dL (ref 30.0–36.0)
MCV: 92.2 fl (ref 78.0–100.0)
Monocytes Absolute: 0.6 K/uL (ref 0.1–1.0)
Monocytes Relative: 8.6 % (ref 3.0–12.0)
Neutro Abs: 4.2 K/uL (ref 1.4–7.7)
Neutrophils Relative %: 56.7 % (ref 43.0–77.0)
Platelets: 254 K/uL (ref 150.0–400.0)
RBC: 4.3 Mil/uL (ref 4.22–5.81)
RDW: 13.6 % (ref 11.5–15.5)
WBC: 7.4 K/uL (ref 4.0–10.5)

## 2024-05-08 LAB — COMPREHENSIVE METABOLIC PANEL WITH GFR
ALT: 11 U/L (ref 0–53)
AST: 15 U/L (ref 0–37)
Albumin: 4.3 g/dL (ref 3.5–5.2)
Alkaline Phosphatase: 93 U/L (ref 39–117)
BUN: 14 mg/dL (ref 6–23)
CO2: 28 meq/L (ref 19–32)
Calcium: 8.9 mg/dL (ref 8.4–10.5)
Chloride: 100 meq/L (ref 96–112)
Creatinine, Ser: 1.03 mg/dL (ref 0.40–1.50)
GFR: 65.92 mL/min (ref >60.00)
Glucose, Bld: 118 mg/dL — ABNORMAL HIGH (ref 70–99)
Potassium: 4.2 meq/L (ref 3.5–5.1)
Sodium: 138 meq/L (ref 135–145)
Total Bilirubin: 0.6 mg/dL (ref 0.2–1.2)
Total Protein: 7.5 g/dL (ref 6.0–8.3)

## 2024-05-08 LAB — VITAMIN B12: Vitamin B-12: 260 pg/mL (ref 211–911)

## 2024-05-08 LAB — PSA, MEDICARE: PSA: 4.06 ng/mL — ABNORMAL HIGH (ref 0.10–4.00)

## 2024-05-08 NOTE — Progress Notes (Signed)
 Phone: [phone]   Subjective:  Patient presents today for their annual physical. Chief complaint-noted.   See problem oriented charting- ROS- full  review of systems was completed and negative  Per full ROS sheet completed by patient except for topics noted under acute/chronic concerns  The following were reviewed and entered/updated in epic: Past Medical History:  Diagnosis Date   Ankylosing spondylitis (HCC)    Diverticulosis of colon (without mention of hemorrhage)    Hypertension    Rheumatoid arthritis(714.0)    Unspecified hearing loss    Patient Active Problem List   Diagnosis Date Noted   B12 deficiency 03/18/2020    Priority: Medium    Hyperlipidemia 02/04/2017    Priority: Medium    BPH associated with nocturia 11/26/2014    Priority: Medium    Hyperglycemia 11/26/2014    Priority: Medium    Essential hypertension 12/30/2009    Priority: Medium    Paresthesias 05/06/2018    Priority: Low   Bowel incontinence 07/04/2015    Priority: Low   Hearing loss 06/06/2008    Priority: Low   Ankylosing spondylitis (HCC) 10/25/2007    Priority: Low   Past Surgical History:  Procedure Laterality Date   ADENOIDECTOMY     CATARACT EXTRACTION, BILATERAL  2020   EYE SURGERY     cataract both eye    INGUINAL HERNIA REPAIR     x3    JOINT REPLACEMENT  hip replacement   3 times, last in 2012   OTHER SURGICAL HISTORY  10/2018   urolift   REVISION TOTAL HIP ARTHROPLASTY  1978/1992   3 hip replacements   TONSILLECTOMY      Family History  Problem Relation Age of Onset   Heart disease Mother    Diabetes Mother    Diabetes Father    Prostate cancer Brother        x2   Diabetes Sister        and migraines    Medications- reviewed and updated Current Outpatient Medications  Medication Sig Dispense Refill   amLODipine  (NORVASC ) 10 MG tablet TAKE 1 TABLET(10 MG) BY MOUTH AT BEDTIME 90 tablet 3   amoxicillin (AMOXIL) 500 MG capsule Take 500 mg by mouth as  needed. Take 4 capsules 1 hour before dental appointments.     vitamin B-12 (CYANOCOBALAMIN ) 1000 MCG tablet Take 1,000 mcg by mouth daily.     No current facility-administered medications for this visit.    Allergies-reviewed and updated No Known Allergies  Social History   Social History Narrative   Married (wife pt outside practice). 1 son. 3 grandkids.       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 Oropharynx normal other than some drainage- see problem oriented note Neck: no thyromegaly CV: RRR no murmurs rubs or gallops Lungs: CTAB no crackles, wheeze, rhonchi Abdomen: soft/nontender/nondistended/normal bowel sounds. No rebound or guarding.  Ext: trace edema Skin: warm, dry Neuro: grossly normal, moves all extremities, PERRLA   Assessment and Plan  88 y.o. male presenting for annual physical.  Health Maintenance counseling: 1. Anticipatory guidance: Patient counseled regarding regular dental exams -q6 months, eye exams - yearly,  avoiding smoking and second hand smoke , limiting alcohol to 2 beverages per day - doesn't drink, no illicit drugs .   2. Risk factor reduction:  Advised patient of need for regular exercise and diet rich and fruits and vegetables to reduce risk of heart attack and stroke.  Exercise- stationary bike 30 minutes most days.  Diet/weight management-stable over last year within 1 lb.  Wt Readings from Last 3 Encounters:  05/08/24 194 lb 6.4 oz (88.2 kg)  05/06/23 193 lb 12.8 oz (87.9 kg)  02/16/23 198 lb 9.6 oz (90.1 kg)  3.  Immunizations/screenings/ancillary studies- flu shot in the fall. Could consider COVID in fall.  Immunization History  Administered Date(s) Administered   Fluad Quad(high Dose 65+) 06/15/2019, 06/26/2020, 07/07/2021   Influenza Split 09/27/2012   Influenza Whole 06/06/2008, 07/03/2010   Influenza, High Dose Seasonal PF 06/07/2018   Influenza,inj,Quad PF,6+ Mos 06/26/2014, 07/04/2015   Influenza-Unspecified 08/05/2016   Moderna SARS-COV2 Booster Vaccination 09/06/2020   Moderna Sars-Covid-2 Vaccination 10/11/2019, 11/03/2019, 07/16/2021   Pneumococcal Conjugate-13 11/26/2014   Pneumococcal Polysaccharide-23 08/28/2004   Td 08/22/1999, 07/03/2010   Tetanus 04/28/2021   Zoster Recombinant(Shingrix) 02/05/2017, 05/10/2017   Zoster, Live 06/06/2008  4. Prostate cancer screening-  up last year but had been 7 years and has known BPH - depending on trend consider urology consult- he saw them as recently as 2000  Lab Results  Component Value Date   PSA 4.16 (H) 05/06/2023   PSA 2.77 01/24/2016   PSA 2.33 11/26/2014   5. Colon cancer screening - [facility] said no further colonoscopy 6. Skin cancer screening- does not see dermatology. advised regular sunscreen use. Denies worrisome, changing, or new skin lesions.  7. Smoking associated screening (lung cancer screening, AAA screen 65-75, UA)- never smoker 8. STD screening - only active with wife  Status of chronic or acute concerns   #social update - wife not doing great- still in his own home- considering downsizing or retirement community   #cough- see other note past age based screening recommendations   #hypertension S: medication: Amlodipine  10 mg (does get some edema) -Avoid hydrochlorothiazide  due to BPH issues -Consider ACE or ARB as next step if needed Home readings #s: 130s/70s at home BP Readings from Last 3 Encounters:  05/08/24 (!) 118/58  05/06/23 138/60  02/16/23 (!) 142/78  A/P: well controlled continue  current medications    #hyperlipidemia S: Medication: none -Past clear age based screening recommendations for statin use  Lab Results  Component Value Date   CHOL 159 05/06/2023   HDL 33.50 (L) 05/06/2023   LDLCALC 103 (H) 05/06/2023   LDLDIRECT 127.0 11/26/2014   TRIG 113.0 05/06/2023   CHOLHDL 5 05/06/2023  A/P: lipids mildly high- continue healthy eating and regular exercise effrots for primary prevention    # Hyperglycemia/insulin resistance/prediabetes June 2022 up to 6.3 A1c S:  Medication: none Lab Results  Component Value Date   HGBA1C 6.2 05/06/2023   HGBA1C 6.2 03/26/2022   HGBA1C 6.3 03/20/2021  A/P: hopefully stable- update a1c today. Continue without meds for now     #BPH with nocturia S: Unfortunately UroLift and medications have not been helpful. No urinary tract infection last year . Prostate overall stable . 2-3 x a night nocturia A/P: still bothersome but has  tried most treatments and seen urology in past   #  B12 deficiency S: Current treatment/medication (oral vs. IM): 1000 mcg B12 orally daily- missing some doses Lab Results  Component Value Date   VITAMINB12 324 05/06/2023  A/P: if levels low likely push for restart   #Ankylosing spondylitis-final diagnosis after rheumatoid arthritis diagnosis.  Intermittent back pain issues still particular for truly hard.  Has required prior surgeries.  Stable on no medication.  Not too bad lately  Recommended follow up: Return in about 1 year (around 05/08/2025) for physical or sooner if needed.Schedule b4 you leave.  Lab/Order associations: fasting other than black coffee   ICD-10-CM   1. Preventative health care  Z00.00     2. Essential hypertension  I10 Lipid panel    CBC w/Diff    Comp Met (CMET)    3. Hyperlipidemia, unspecified hyperlipidemia type  E78.5 Lipid panel    CBC w/Diff    Comp Met (CMET)    4. Hyperglycemia  R73.9 Hemoglobin A1c    5. Screening for diabetes mellitus  Z13.1 Hemoglobin A1c     6. B12 deficiency  E53.8 Vitamin B12    7. Ankylosing spondylitis of multiple sites in spine (HCC) Chronic M45.0     8. Screening for prostate cancer  Z12.5 PSA, Medicare      No orders of the defined types were placed in this encounter.   Return precautions advised.  Garnette Lukes, MD

## 2024-05-08 NOTE — Addendum Note (Signed)
 Addended by: KATRINKA GARNETTE KIDD on: 05/08/2024 09:29 AM   Modules accepted: Level of Service

## 2024-05-08 NOTE — Progress Notes (Signed)
  Phone [phone] In person visit   Subjective:   Jason [name] is a 88 y.o. year old very pleasant male patient who presents for/with See problem oriented charting  Past Medical History-  Patient Active Problem List   Diagnosis Date Noted   B12 deficiency 03/18/2020    Priority: Medium    Hyperlipidemia 02/04/2017    Priority: Medium    BPH associated with nocturia 11/26/2014    Priority: Medium    Hyperglycemia 11/26/2014    Priority: Medium    Essential hypertension 12/30/2009    Priority: Medium    Paresthesias 05/06/2018    Priority: Low   Bowel incontinence 07/04/2015    Priority: Low   Hearing loss 06/06/2008    Priority: Low   Ankylosing spondylitis (HCC) 10/25/2007    Priority: Low    Medications- reviewed and updated Current Outpatient Medications  Medication Sig Dispense Refill   amLODipine  (NORVASC ) 10 MG tablet TAKE 1 TABLET(10 MG) BY MOUTH AT BEDTIME 90 tablet 3   amoxicillin (AMOXIL) 500 MG capsule Take 500 mg by mouth as needed. Take 4 capsules 1 hour before dental appointments.     vitamin B-12 (CYANOCOBALAMIN ) 1000 MCG tablet Take 1,000 mcg by mouth daily.     No current facility-administered medications for this visit.     Objective:  BP (!) 118/58 (BP Location: Left Arm, Patient Position: Sitting, Cuff Size: Normal)   Pulse 71   Temp (!) 97.2 F (36.2 C) (Temporal)   Ht 5' 11 (1.803 m)   Wt 194 lb 6.4 oz (88.2 kg)   SpO2 97%   BMI 27.11 kg/m  Gen: NAD, resting comfortably Nasal turbinates edematous and somewhat pale.  Clear nasal discharge noted.  Tympanic membranes normal bilaterally-particular on the right side the external canal looks completely normal where he is having some itching.  Oropharynx normal other than some drainage posteriorly Lungs clear to auscultation bilaterally      Assessment and Plan    # cough/throat congestion S:6-8 months of symptoms. some congestion in throat particularly early in the morning- some  early morning cough and sneezing. Has not tried anything yet. Also right ear itches fair amount.  No watery eyes.  - mildly winded [place] - [place] decent distance A/P: cough for 6-8 months. possible allergies/pnd but rule out malignancy or infection with x-ray -recommended allegra or claritin before bed for 2 weeks and if no better ADD Flonase OTC (available over the counter without a prescription) for 2 more weeks. If no better let me know- may consider reflux medicine   Recommended follow up: as needed for acute concern if not improving   Lab/Order associations:   ICD-10-CM   1. Chronic cough  R05.3     No orders of the defined types were placed in this encounter.   Return precautions advised.  Garnette Lukes, MD

## 2024-05-08 NOTE — Patient Instructions (Addendum)
 Please stop by lab before you go If you have mychart- we will send your results within 3 business days of us  receiving them.  If you do not have mychart- we will call you about results within 5 business days of us  receiving them.  *please also note that you will see labs on mychart as soon as they post. I will later go in and write notes on them- will say notes from Dr. Katrinka   ALSO x-ray  Keep up great job with exercise!   cough for 6-8 months. possible allergies/ post nasal drip  but evaluate for cancer or infection with x-ray today  -recommended allegra or claritin before bed for 2 weeks and if no better ADD Flonase OTC (available over the counter without a prescription) for 2 more weeks in the mornings.  If no better let me know- may consider reflux medicine - let me know  Recommended follow up: Return in about 1 year (around 05/08/2025) for physical or sooner if needed.Schedule b4 you leave.

## 2024-05-09 LAB — LIPID PANEL
Cholesterol: 166 mg/dL (ref <200)
HDL: 39 mg/dL — ABNORMAL LOW (ref >=40)
LDL Cholesterol (Calc): 100 mg/dL — ABNORMAL HIGH
Non-HDL Cholesterol (Calc): 127 mg/dL (ref <130)
Total CHOL/HDL Ratio: 4.3 (calc) (ref <5.0)
Triglycerides: 174 mg/dL — ABNORMAL HIGH (ref <150)

## 2024-05-09 LAB — HEMOGLOBIN A1C
Hgb A1c MFr Bld: 6.3 % — ABNORMAL HIGH (ref <5.7)
Mean Plasma Glucose: 134 mg/dL
eAG (mmol/L): 7.4 mmol/L

## 2024-06-19 DIAGNOSIS — K080 Exfoliation of teeth due to systemic causes: Secondary | ICD-10-CM | POA: Diagnosis not present

## 2024-07-26 ENCOUNTER — Other Ambulatory Visit: Payer: Self-pay | Admitting: Family Medicine

## 2024-07-26 DIAGNOSIS — K080 Exfoliation of teeth due to systemic causes: Secondary | ICD-10-CM | POA: Diagnosis not present

## 2025-05-10 ENCOUNTER — Encounter: Admitting: Family Medicine

## 2025-05-14 ENCOUNTER — Encounter: Admitting: Family Medicine
